# Patient Record
Sex: Male | Born: 1940 | Race: White | Hispanic: No | Smoking: Never smoker
Health system: Southern US, Community
[De-identification: ages and names within clinical notes are randomized; demographics above are authoritative.]

## PROBLEM LIST (undated history)

## (undated) DIAGNOSIS — K7460 Unspecified cirrhosis of liver: Secondary | ICD-10-CM

## (undated) DIAGNOSIS — I4891 Unspecified atrial fibrillation: Secondary | ICD-10-CM

## (undated) DIAGNOSIS — I499 Cardiac arrhythmia, unspecified: Secondary | ICD-10-CM

## (undated) DIAGNOSIS — K769 Liver disease, unspecified: Secondary | ICD-10-CM

## (undated) DIAGNOSIS — E119 Type 2 diabetes mellitus without complications: Secondary | ICD-10-CM

## (undated) HISTORY — PX: HERNIA REPAIR: SHX51

## (undated) HISTORY — PX: OTHER SURGICAL HISTORY: SHX169

## (undated) HISTORY — PX: EYE SURGERY: SHX253

---

## 2008-12-30 ENCOUNTER — Emergency Department: Payer: Self-pay | Admitting: Emergency Medicine

## 2009-01-18 ENCOUNTER — Emergency Department: Payer: Self-pay | Admitting: Emergency Medicine

## 2010-02-07 ENCOUNTER — Emergency Department: Payer: Self-pay | Admitting: Emergency Medicine

## 2010-11-21 ENCOUNTER — Inpatient Hospital Stay: Payer: Self-pay | Admitting: Surgery

## 2011-05-11 ENCOUNTER — Emergency Department: Payer: Self-pay | Admitting: *Deleted

## 2011-12-14 DIAGNOSIS — Z148 Genetic carrier of other disease: Secondary | ICD-10-CM | POA: Insufficient documentation

## 2012-02-02 ENCOUNTER — Inpatient Hospital Stay: Payer: Self-pay | Admitting: Student

## 2012-02-02 LAB — URINALYSIS, COMPLETE
Bacteria: NONE SEEN
Bilirubin,UR: NEGATIVE
Glucose,UR: 50 mg/dL (ref 0–75)
Hyaline Cast: 10
Ketone: NEGATIVE
Ph: 5 (ref 4.5–8.0)
Protein: NEGATIVE
RBC,UR: 7 /HPF (ref 0–5)
Specific Gravity: 1.006 (ref 1.003–1.030)
Squamous Epithelial: <1
WBC UR: <1 /HPF (ref 0–5)

## 2012-02-02 LAB — COMPREHENSIVE METABOLIC PANEL
Albumin: 2.9 g/dL — ABNORMAL LOW (ref 3.4–5.0)
Alkaline Phosphatase: 130 U/L (ref 50–136)
Anion Gap: 7 (ref 7–16)
BUN: 35 mg/dL — ABNORMAL HIGH (ref 7–18)
Bilirubin,Total: 1.2 mg/dL — ABNORMAL HIGH (ref 0.2–1.0)
Calcium, Total: 8.4 mg/dL — ABNORMAL LOW (ref 8.5–10.1)
Co2: 26 mmol/L (ref 21–32)
EGFR (Non-African Amer.): 31 — ABNORMAL LOW
Glucose: 322 mg/dL — ABNORMAL HIGH (ref 65–99)
Osmolality: 283 (ref 275–301)
Potassium: 4.5 mmol/L (ref 3.5–5.1)
SGOT(AST): 30 U/L (ref 15–37)
SGPT (ALT): 33 U/L (ref 12–78)
Total Protein: 7.2 g/dL (ref 6.4–8.2)

## 2012-02-02 LAB — CBC
HCT: 23.8 % — ABNORMAL LOW (ref 40.0–52.0)
MCH: 29.2 pg (ref 26.0–34.0)
MCHC: 32.6 g/dL (ref 32.0–36.0)
MCV: 89 fL (ref 80–100)
Platelet: 50 10*3/uL — ABNORMAL LOW (ref 150–440)
RBC: 2.66 10*6/uL — ABNORMAL LOW (ref 4.40–5.90)
RDW: 17.6 % — ABNORMAL HIGH (ref 11.5–14.5)

## 2012-02-02 LAB — AMMONIA: Ammonia, Plasma: 102 mcmol/L — ABNORMAL HIGH (ref 11–32)

## 2012-02-03 LAB — FERRITIN: Ferritin (ARMC): 12 ng/mL (ref 8–388)

## 2012-02-03 LAB — BASIC METABOLIC PANEL
Anion Gap: 7 (ref 7–16)
BUN: 31 mg/dL — ABNORMAL HIGH (ref 7–18)
Chloride: 103 mmol/L (ref 98–107)
Co2: 26 mmol/L (ref 21–32)
Creatinine: 1.83 mg/dL — ABNORMAL HIGH (ref 0.60–1.30)
EGFR (African American): 42 — ABNORMAL LOW
EGFR (Non-African Amer.): 37 — ABNORMAL LOW
Glucose: 239 mg/dL — ABNORMAL HIGH (ref 65–99)
Sodium: 136 mmol/L (ref 136–145)

## 2012-02-03 LAB — CBC WITH DIFFERENTIAL/PLATELET
Basophil %: 0.7 %
Eosinophil #: 0.1 10*3/uL (ref 0.0–0.7)
Eosinophil %: 2.5 %
HCT: 23.4 % — ABNORMAL LOW (ref 40.0–52.0)
Lymphocyte #: 0.4 10*3/uL — ABNORMAL LOW (ref 1.0–3.6)
Lymphocyte %: 12 %
MCH: 29.6 pg (ref 26.0–34.0)
MCHC: 33.1 g/dL (ref 32.0–36.0)
Monocyte #: 0.5 x10 3/mm (ref 0.2–1.0)
RDW: 17.6 % — ABNORMAL HIGH (ref 11.5–14.5)

## 2012-02-04 LAB — CBC WITH DIFFERENTIAL/PLATELET
Basophil #: 0 10*3/uL (ref 0.0–0.1)
Basophil %: 0.5 %
Eosinophil #: 0.1 10*3/uL (ref 0.0–0.7)
HCT: 22.5 % — ABNORMAL LOW (ref 40.0–52.0)
HGB: 7.6 g/dL — ABNORMAL LOW (ref 13.0–18.0)
Lymphocyte #: 0.4 10*3/uL — ABNORMAL LOW (ref 1.0–3.6)
Lymphocyte %: 10.1 %
MCHC: 33.8 g/dL (ref 32.0–36.0)
MCV: 90 fL (ref 80–100)
Monocyte #: 0.6 x10 3/mm (ref 0.2–1.0)
Neutrophil #: 2.6 10*3/uL (ref 1.4–6.5)
Platelet: 44 10*3/uL — ABNORMAL LOW (ref 150–440)
RDW: 17.9 % — ABNORMAL HIGH (ref 11.5–14.5)
WBC: 3.7 10*3/uL — ABNORMAL LOW (ref 3.8–10.6)

## 2012-02-04 LAB — BASIC METABOLIC PANEL
BUN: 27 mg/dL — ABNORMAL HIGH (ref 7–18)
Calcium, Total: 8.3 mg/dL — ABNORMAL LOW (ref 8.5–10.1)
Chloride: 103 mmol/L (ref 98–107)
Co2: 25 mmol/L (ref 21–32)
EGFR (Non-African Amer.): 45 — ABNORMAL LOW
Glucose: 174 mg/dL — ABNORMAL HIGH (ref 65–99)
Osmolality: 279 (ref 275–301)
Potassium: 4.5 mmol/L (ref 3.5–5.1)
Sodium: 135 mmol/L — ABNORMAL LOW (ref 136–145)

## 2012-02-04 LAB — AMMONIA: Ammonia, Plasma: 29 mcmol/L (ref 11–32)

## 2012-02-05 LAB — CBC WITH DIFFERENTIAL/PLATELET
Basophil #: 0 10*3/uL (ref 0.0–0.1)
Basophil %: 0.8 %
Eosinophil #: 0.1 10*3/uL (ref 0.0–0.7)
HCT: 22.5 % — ABNORMAL LOW (ref 40.0–52.0)
Lymphocyte #: 0.4 10*3/uL — ABNORMAL LOW (ref 1.0–3.6)
Lymphocyte %: 10.7 %
Monocyte %: 19.8 %
Platelet: 43 10*3/uL — ABNORMAL LOW (ref 150–440)
RBC: 2.51 10*6/uL — ABNORMAL LOW (ref 4.40–5.90)
RDW: 17.9 % — ABNORMAL HIGH (ref 11.5–14.5)
WBC: 3.8 10*3/uL (ref 3.8–10.6)

## 2012-09-21 ENCOUNTER — Ambulatory Visit: Payer: Self-pay | Admitting: Gastroenterology

## 2012-11-10 ENCOUNTER — Inpatient Hospital Stay: Payer: Self-pay | Admitting: Internal Medicine

## 2012-11-10 LAB — COMPREHENSIVE METABOLIC PANEL
Alkaline Phosphatase: 109 U/L (ref 50–136)
Anion Gap: 8 (ref 7–16)
BUN: 37 mg/dL — ABNORMAL HIGH (ref 7–18)
Chloride: 102 mmol/L (ref 98–107)
Creatinine: 1.56 mg/dL — ABNORMAL HIGH (ref 0.60–1.30)
Glucose: 144 mg/dL — ABNORMAL HIGH (ref 65–99)
Osmolality: 276 (ref 275–301)
Potassium: 4.4 mmol/L (ref 3.5–5.1)
SGOT(AST): 49 U/L — ABNORMAL HIGH (ref 15–37)
SGPT (ALT): 38 U/L (ref 12–78)
Sodium: 132 mmol/L — ABNORMAL LOW (ref 136–145)
Total Protein: 7.3 g/dL (ref 6.4–8.2)

## 2012-11-10 LAB — URINALYSIS, COMPLETE
Blood: NEGATIVE
Glucose,UR: NEGATIVE mg/dL (ref 0–75)
Leukocyte Esterase: NEGATIVE
Ph: 6 (ref 4.5–8.0)
Protein: NEGATIVE
WBC UR: NONE SEEN /HPF (ref 0–5)

## 2012-11-10 LAB — CBC
HCT: 24.5 % — ABNORMAL LOW (ref 40.0–52.0)
HGB: 8.3 g/dL — ABNORMAL LOW (ref 13.0–18.0)
MCH: 34.7 pg — ABNORMAL HIGH (ref 26.0–34.0)
MCHC: 33.8 g/dL (ref 32.0–36.0)
MCV: 103 fL — ABNORMAL HIGH (ref 80–100)
Platelet: 58 10*3/uL — ABNORMAL LOW (ref 150–440)
RBC: 2.39 10*6/uL — ABNORMAL LOW (ref 4.40–5.90)
RDW: 15 % — ABNORMAL HIGH (ref 11.5–14.5)

## 2012-11-11 LAB — BASIC METABOLIC PANEL
Anion Gap: 5 — ABNORMAL LOW (ref 7–16)
BUN: 30 mg/dL — ABNORMAL HIGH (ref 7–18)
Calcium, Total: 8.2 mg/dL — ABNORMAL LOW (ref 8.5–10.1)
Chloride: 108 mmol/L — ABNORMAL HIGH (ref 98–107)
Co2: 24 mmol/L (ref 21–32)
Creatinine: 1.38 mg/dL — ABNORMAL HIGH (ref 0.60–1.30)
EGFR (African American): 60 — ABNORMAL LOW
EGFR (Non-African Amer.): 51 — ABNORMAL LOW
Glucose: 93 mg/dL (ref 65–99)
Osmolality: 280 (ref 275–301)
Potassium: 4 mmol/L (ref 3.5–5.1)

## 2012-11-11 LAB — CBC WITH DIFFERENTIAL/PLATELET
Basophil #: 0 10*3/uL (ref 0.0–0.1)
Eosinophil #: 0.1 10*3/uL (ref 0.0–0.7)
Eosinophil %: 6.8 %
HCT: 20.9 % — ABNORMAL LOW (ref 40.0–52.0)
Lymphocyte #: 0.3 10*3/uL — ABNORMAL LOW (ref 1.0–3.6)
Lymphocyte %: 13.4 %
MCH: 35.8 pg — ABNORMAL HIGH (ref 26.0–34.0)
MCV: 102 fL — ABNORMAL HIGH (ref 80–100)
Neutrophil #: 1.3 10*3/uL — ABNORMAL LOW (ref 1.4–6.5)
Neutrophil %: 58.1 %
Platelet: 40 10*3/uL — ABNORMAL LOW (ref 150–440)
RBC: 2.06 10*6/uL — ABNORMAL LOW (ref 4.40–5.90)
RDW: 14.9 % — ABNORMAL HIGH (ref 11.5–14.5)
WBC: 2.2 10*3/uL — ABNORMAL LOW (ref 3.8–10.6)

## 2012-11-11 LAB — HEMOGLOBIN A1C: Hemoglobin A1C: <3.5 % — ABNORMAL LOW (ref 4.2–6.3)

## 2012-11-12 LAB — BASIC METABOLIC PANEL
Calcium, Total: 8.2 mg/dL — ABNORMAL LOW (ref 8.5–10.1)
Chloride: 107 mmol/L (ref 98–107)
Creatinine: 1.23 mg/dL (ref 0.60–1.30)
EGFR (Non-African Amer.): 59 — ABNORMAL LOW
Glucose: 75 mg/dL (ref 65–99)
Osmolality: 279 (ref 275–301)
Potassium: 3.8 mmol/L (ref 3.5–5.1)
Sodium: 138 mmol/L (ref 136–145)

## 2012-11-13 ENCOUNTER — Ambulatory Visit: Payer: Self-pay | Admitting: Internal Medicine

## 2012-11-16 ENCOUNTER — Inpatient Hospital Stay: Payer: Self-pay | Admitting: Student

## 2012-11-16 LAB — URINALYSIS, COMPLETE
Bilirubin,UR: NEGATIVE
Glucose,UR: NEGATIVE mg/dL (ref 0–75)
Ketone: NEGATIVE
Leukocyte Esterase: NEGATIVE
Ph: 5 (ref 4.5–8.0)
Protein: NEGATIVE
Specific Gravity: 1.014 (ref 1.003–1.030)
Squamous Epithelial: <1
WBC UR: <1 /HPF (ref 0–5)

## 2012-11-16 LAB — CBC
HCT: 22.7 % — ABNORMAL LOW (ref 40.0–52.0)
MCHC: 34.3 g/dL (ref 32.0–36.0)
Platelet: 47 10*3/uL — ABNORMAL LOW (ref 150–440)
RDW: 15.2 % — ABNORMAL HIGH (ref 11.5–14.5)
WBC: 3.5 10*3/uL — ABNORMAL LOW (ref 3.8–10.6)

## 2012-11-16 LAB — COMPREHENSIVE METABOLIC PANEL
Albumin: 2.7 g/dL — ABNORMAL LOW (ref 3.4–5.0)
Alkaline Phosphatase: 133 U/L (ref 50–136)
Calcium, Total: 7.9 mg/dL — ABNORMAL LOW (ref 8.5–10.1)
Co2: 23 mmol/L (ref 21–32)
EGFR (Non-African Amer.): 54 — ABNORMAL LOW
Potassium: 3.7 mmol/L (ref 3.5–5.1)
Total Protein: 6.7 g/dL (ref 6.4–8.2)

## 2012-11-16 LAB — ETHANOL: Ethanol %: <0.003 % (ref 0.000–0.080)

## 2012-11-16 LAB — TROPONIN I: Troponin-I: <0.02 ng/mL

## 2012-11-16 LAB — PROTIME-INR
INR: 1.4
Prothrombin Time: 17.5 secs — ABNORMAL HIGH (ref 11.5–14.7)

## 2012-11-16 LAB — AMMONIA: Ammonia, Plasma: 60 mcmol/L — ABNORMAL HIGH (ref 11–32)

## 2012-11-17 LAB — HEMOGLOBIN A1C: Hemoglobin A1C: <3.5 % — ABNORMAL LOW (ref 4.2–6.3)

## 2012-11-17 LAB — BASIC METABOLIC PANEL
Anion Gap: 6 — ABNORMAL LOW (ref 7–16)
BUN: 29 mg/dL — ABNORMAL HIGH (ref 7–18)
Chloride: 104 mmol/L (ref 98–107)
Creatinine: 1.48 mg/dL — ABNORMAL HIGH (ref 0.60–1.30)
Potassium: 3.8 mmol/L (ref 3.5–5.1)
Sodium: 137 mmol/L (ref 136–145)

## 2012-11-17 LAB — MAGNESIUM: Magnesium: 2.5 mg/dL — ABNORMAL HIGH

## 2012-11-18 LAB — PROTIME-INR
INR: 1.4
Prothrombin Time: 17.3 secs — ABNORMAL HIGH (ref 11.5–14.7)

## 2012-11-18 LAB — BASIC METABOLIC PANEL
Anion Gap: 5 — ABNORMAL LOW (ref 7–16)
BUN: 31 mg/dL — ABNORMAL HIGH (ref 7–18)
Calcium, Total: 8.1 mg/dL — ABNORMAL LOW (ref 8.5–10.1)
Chloride: 106 mmol/L (ref 98–107)
Co2: 26 mmol/L (ref 21–32)
EGFR (African American): 54 — ABNORMAL LOW
EGFR (Non-African Amer.): 47 — ABNORMAL LOW
Osmolality: 282 (ref 275–301)
Potassium: 4 mmol/L (ref 3.5–5.1)

## 2012-11-18 LAB — AMMONIA: Ammonia, Plasma: 64 mcmol/L — ABNORMAL HIGH (ref 11–32)

## 2012-11-18 LAB — APTT: Activated PTT: 37.4 secs — ABNORMAL HIGH (ref 23.6–35.9)

## 2012-11-19 LAB — AMMONIA: Ammonia, Plasma: 105 umol/L — ABNORMAL HIGH

## 2012-11-26 ENCOUNTER — Inpatient Hospital Stay: Payer: Self-pay | Admitting: Internal Medicine

## 2012-11-26 LAB — COMPREHENSIVE METABOLIC PANEL
Albumin: 2.8 g/dL — ABNORMAL LOW (ref 3.4–5.0)
Alkaline Phosphatase: 114 U/L (ref 50–136)
BUN: 27 mg/dL — ABNORMAL HIGH (ref 7–18)
Chloride: 103 mmol/L (ref 98–107)
EGFR (African American): 53 — ABNORMAL LOW
Glucose: 131 mg/dL — ABNORMAL HIGH (ref 65–99)
Osmolality: 275 (ref 275–301)
Sodium: 134 mmol/L — ABNORMAL LOW (ref 136–145)
Total Protein: 6.8 g/dL (ref 6.4–8.2)

## 2012-11-26 LAB — URINALYSIS, COMPLETE
Bacteria: NONE SEEN
Blood: NEGATIVE
Glucose,UR: NEGATIVE mg/dL (ref 0–75)
Nitrite: NEGATIVE
Ph: 7 (ref 4.5–8.0)
RBC,UR: 1 /HPF (ref 0–5)
Specific Gravity: 1.018 (ref 1.003–1.030)
Squamous Epithelial: <1

## 2012-11-26 LAB — PROTIME-INR
INR: 1.5
Prothrombin Time: 17.7 secs — ABNORMAL HIGH (ref 11.5–14.7)

## 2012-11-26 LAB — CBC
HGB: 7.4 g/dL — ABNORMAL LOW (ref 13.0–18.0)
MCV: 102 fL — ABNORMAL HIGH (ref 80–100)
RBC: 2.18 10*6/uL — ABNORMAL LOW (ref 4.40–5.90)
WBC: 2.7 10*3/uL — ABNORMAL LOW (ref 3.8–10.6)

## 2012-11-26 LAB — TROPONIN I: Troponin-I: <0.02 ng/mL

## 2012-11-27 LAB — CBC WITH DIFFERENTIAL/PLATELET
Basophil #: 0 10*3/uL (ref 0.0–0.1)
Basophil %: 1 %
Eosinophil #: 0.2 10*3/uL (ref 0.0–0.7)
Lymphocyte #: 0.3 10*3/uL — ABNORMAL LOW (ref 1.0–3.6)
Lymphocyte %: 14.9 %
MCH: 35.3 pg — ABNORMAL HIGH (ref 26.0–34.0)
MCHC: 34.4 g/dL (ref 32.0–36.0)
MCV: 102 fL — ABNORMAL HIGH (ref 80–100)
Monocyte #: 0.4 x10 3/mm (ref 0.2–1.0)
Monocyte %: 16.6 %
Neutrophil #: 1.3 10*3/uL — ABNORMAL LOW (ref 1.4–6.5)
RBC: 1.99 10*6/uL — ABNORMAL LOW (ref 4.40–5.90)
RDW: 17.6 % — ABNORMAL HIGH (ref 11.5–14.5)

## 2012-11-27 LAB — BASIC METABOLIC PANEL
Anion Gap: 9 (ref 7–16)
BUN: 25 mg/dL — ABNORMAL HIGH (ref 7–18)
EGFR (African American): 49 — ABNORMAL LOW
EGFR (Non-African Amer.): 42 — ABNORMAL LOW

## 2012-11-27 LAB — PROTIME-INR
INR: 1.5
Prothrombin Time: 17.9 secs — ABNORMAL HIGH (ref 11.5–14.7)

## 2012-11-28 LAB — CBC WITH DIFFERENTIAL/PLATELET
Basophil #: 0 10*3/uL (ref 0.0–0.1)
Basophil %: 1.4 %
Eosinophil #: 0.2 10*3/uL (ref 0.0–0.7)
HCT: 22.5 % — ABNORMAL LOW (ref 40.0–52.0)
Lymphocyte #: 0.4 10*3/uL — ABNORMAL LOW (ref 1.0–3.6)
Lymphocyte %: 16.4 %
MCHC: 34.7 g/dL (ref 32.0–36.0)
MCV: 100 fL (ref 80–100)
Monocyte #: 0.4 x10 3/mm (ref 0.2–1.0)
Platelet: 37 10*3/uL — ABNORMAL LOW (ref 150–440)
RBC: 2.26 10*6/uL — ABNORMAL LOW (ref 4.40–5.90)
WBC: 2.3 10*3/uL — ABNORMAL LOW (ref 3.8–10.6)

## 2012-11-28 LAB — BASIC METABOLIC PANEL
BUN: 28 mg/dL — ABNORMAL HIGH (ref 7–18)
Calcium, Total: 8.2 mg/dL — ABNORMAL LOW (ref 8.5–10.1)
Chloride: 108 mmol/L — ABNORMAL HIGH (ref 98–107)
EGFR (African American): 52 — ABNORMAL LOW
EGFR (Non-African Amer.): 45 — ABNORMAL LOW
Osmolality: 284 (ref 275–301)
Potassium: 3.7 mmol/L (ref 3.5–5.1)
Sodium: 139 mmol/L (ref 136–145)

## 2012-12-02 ENCOUNTER — Ambulatory Visit: Payer: Self-pay | Admitting: Gastroenterology

## 2012-12-02 LAB — CULTURE, BLOOD (SINGLE)

## 2012-12-09 LAB — CBC
MCH: 34.7 pg — ABNORMAL HIGH (ref 26.0–34.0)
MCHC: 34.1 g/dL (ref 32.0–36.0)
RBC: 2.57 10*6/uL — ABNORMAL LOW (ref 4.40–5.90)
RDW: 19.8 % — ABNORMAL HIGH (ref 11.5–14.5)
WBC: 4 10*3/uL (ref 3.8–10.6)

## 2012-12-10 ENCOUNTER — Observation Stay: Payer: Self-pay | Admitting: Internal Medicine

## 2012-12-10 LAB — COMPREHENSIVE METABOLIC PANEL
Albumin: 2.8 g/dL — ABNORMAL LOW (ref 3.4–5.0)
Alkaline Phosphatase: 114 U/L (ref 50–136)
BUN: 33 mg/dL — ABNORMAL HIGH (ref 7–18)
Calcium, Total: 8.2 mg/dL — ABNORMAL LOW (ref 8.5–10.1)
Calcium, Total: 8.4 mg/dL — ABNORMAL LOW (ref 8.5–10.1)
Creatinine: 1.56 mg/dL — ABNORMAL HIGH (ref 0.60–1.30)
Creatinine: 1.68 mg/dL — ABNORMAL HIGH (ref 0.60–1.30)
EGFR (African American): 47 — ABNORMAL LOW
EGFR (African American): 51 — ABNORMAL LOW
EGFR (Non-African Amer.): 40 — ABNORMAL LOW
Glucose: 122 mg/dL — ABNORMAL HIGH (ref 65–99)
Glucose: 201 mg/dL — ABNORMAL HIGH (ref 65–99)
Osmolality: 281 (ref 275–301)
Osmolality: 284 (ref 275–301)
SGOT(AST): 32 U/L (ref 15–37)
SGPT (ALT): 30 U/L (ref 12–78)
SGPT (ALT): 31 U/L (ref 12–78)
Sodium: 136 mmol/L (ref 136–145)
Total Protein: 6.4 g/dL (ref 6.4–8.2)
Total Protein: 6.9 g/dL (ref 6.4–8.2)

## 2012-12-10 LAB — URINALYSIS, COMPLETE
Bilirubin,UR: NEGATIVE
Blood: NEGATIVE
Ketone: NEGATIVE
Leukocyte Esterase: NEGATIVE
Nitrite: NEGATIVE
Ph: 5 (ref 4.5–8.0)
Protein: NEGATIVE
Squamous Epithelial: <1

## 2012-12-10 LAB — AMMONIA: Ammonia, Plasma: 184 mcmol/L — ABNORMAL HIGH (ref 11–32)

## 2012-12-11 LAB — CBC WITH DIFFERENTIAL/PLATELET
Basophil #: 0.1 10*3/uL (ref 0.0–0.1)
Basophil %: 1.9 %
Eosinophil %: 9.4 %
HCT: 23.3 % — ABNORMAL LOW (ref 40.0–52.0)
HGB: 8 g/dL — ABNORMAL LOW (ref 13.0–18.0)
Lymphocyte %: 13.9 %
MCH: 35.3 pg — ABNORMAL HIGH (ref 26.0–34.0)
MCHC: 34.5 g/dL (ref 32.0–36.0)
Monocyte #: 0.5 x10 3/mm (ref 0.2–1.0)
Monocyte %: 16.6 %
Neutrophil %: 58.2 %
Platelet: 40 10*3/uL — ABNORMAL LOW (ref 150–440)
RBC: 2.28 10*6/uL — ABNORMAL LOW (ref 4.40–5.90)

## 2012-12-11 LAB — BASIC METABOLIC PANEL
Anion Gap: 4 — ABNORMAL LOW (ref 7–16)
Creatinine: 1.51 mg/dL — ABNORMAL HIGH (ref 0.60–1.30)
Osmolality: 282 (ref 275–301)

## 2012-12-11 LAB — AMMONIA: Ammonia, Plasma: <25 mcmol/L (ref 11–32)

## 2012-12-13 ENCOUNTER — Ambulatory Visit: Payer: Self-pay | Admitting: Internal Medicine

## 2012-12-28 ENCOUNTER — Inpatient Hospital Stay: Payer: Self-pay | Admitting: Internal Medicine

## 2012-12-28 LAB — CBC
HCT: 21.7 % — ABNORMAL LOW (ref 40.0–52.0)
MCH: 35.3 pg — ABNORMAL HIGH (ref 26.0–34.0)
MCHC: 34 g/dL (ref 32.0–36.0)
Platelet: 63 10*3/uL — ABNORMAL LOW (ref 150–440)
RBC: 2.09 10*6/uL — ABNORMAL LOW (ref 4.40–5.90)
RDW: 17.8 % — ABNORMAL HIGH (ref 11.5–14.5)
WBC: 5.9 10*3/uL (ref 3.8–10.6)

## 2012-12-28 LAB — URINALYSIS, COMPLETE
Bacteria: NONE SEEN
Blood: NEGATIVE
Hyaline Cast: 18
Ketone: NEGATIVE
Ph: 5 (ref 4.5–8.0)
RBC,UR: <1 /HPF (ref 0–5)
Specific Gravity: 1.009 (ref 1.003–1.030)
Squamous Epithelial: <1

## 2012-12-28 LAB — HEMOGLOBIN: HGB: 6.4 g/dL — ABNORMAL LOW (ref 13.0–18.0)

## 2012-12-28 LAB — COMPREHENSIVE METABOLIC PANEL
Albumin: 2.7 g/dL — ABNORMAL LOW (ref 3.4–5.0)
Alkaline Phosphatase: 106 U/L (ref 50–136)
Anion Gap: 3 — ABNORMAL LOW (ref 7–16)
BUN: 42 mg/dL — ABNORMAL HIGH (ref 7–18)
Calcium, Total: 8.3 mg/dL — ABNORMAL LOW (ref 8.5–10.1)
Co2: 28 mmol/L (ref 21–32)
Creatinine: 1.92 mg/dL — ABNORMAL HIGH (ref 0.60–1.30)
EGFR (African American): 40 — ABNORMAL LOW
Potassium: 4.6 mmol/L (ref 3.5–5.1)
SGOT(AST): 42 U/L — ABNORMAL HIGH (ref 15–37)
Total Protein: 6.6 g/dL (ref 6.4–8.2)

## 2012-12-28 LAB — PROTIME-INR
INR: 1.4
Prothrombin Time: 16.7 secs — ABNORMAL HIGH (ref 11.5–14.7)

## 2012-12-29 LAB — CBC WITH DIFFERENTIAL/PLATELET
Basophil #: 0 10*3/uL (ref 0.0–0.1)
Basophil %: 0.9 %
Eosinophil #: 0.2 10*3/uL (ref 0.0–0.7)
Eosinophil %: 9.2 %
HCT: 23.2 % — ABNORMAL LOW (ref 40.0–52.0)
Lymphocyte #: 0.4 10*3/uL — ABNORMAL LOW (ref 1.0–3.6)
MCH: 33.2 pg (ref 26.0–34.0)
MCV: 99 fL (ref 80–100)
Monocyte #: 0.4 x10 3/mm (ref 0.2–1.0)
Monocyte %: 17.1 %
Neutrophil #: 1.5 10*3/uL (ref 1.4–6.5)
Neutrophil %: 58.5 %
Platelet: 44 10*3/uL — ABNORMAL LOW (ref 150–440)
RDW: 23.6 % — ABNORMAL HIGH (ref 11.5–14.5)
WBC: 2.5 10*3/uL — ABNORMAL LOW (ref 3.8–10.6)

## 2012-12-29 LAB — COMPREHENSIVE METABOLIC PANEL
Albumin: 2.4 g/dL — ABNORMAL LOW (ref 3.4–5.0)
Alkaline Phosphatase: 93 U/L (ref 50–136)
Anion Gap: 7 (ref 7–16)
Calcium, Total: 8.2 mg/dL — ABNORMAL LOW (ref 8.5–10.1)
Chloride: 106 mmol/L (ref 98–107)
Co2: 24 mmol/L (ref 21–32)
EGFR (African American): 44 — ABNORMAL LOW
EGFR (Non-African Amer.): 38 — ABNORMAL LOW
Glucose: 109 mg/dL — ABNORMAL HIGH (ref 65–99)
Potassium: 4.5 mmol/L (ref 3.5–5.1)
SGOT(AST): 36 U/L (ref 15–37)
SGPT (ALT): 29 U/L (ref 12–78)
Total Protein: 6 g/dL — ABNORMAL LOW (ref 6.4–8.2)

## 2012-12-29 LAB — HEMOGLOBIN
HGB: 5.8 g/dL — ABNORMAL LOW (ref 13.0–18.0)
HGB: 7.9 g/dL — ABNORMAL LOW (ref 13.0–18.0)

## 2012-12-29 LAB — AMMONIA: Ammonia, Plasma: 30 mcmol/L (ref 11–32)

## 2012-12-29 LAB — MAGNESIUM: Magnesium: 2 mg/dL

## 2012-12-30 LAB — CBC WITH DIFFERENTIAL/PLATELET
Eosinophil #: 0.2 10*3/uL (ref 0.0–0.7)
Eosinophil %: 7.6 %
HCT: 23.6 % — ABNORMAL LOW (ref 40.0–52.0)
Lymphocyte %: 10.8 %
MCV: 100 fL (ref 80–100)
Monocyte %: 14 %
Neutrophil #: 1.8 10*3/uL (ref 1.4–6.5)
Neutrophil %: 66.7 %
RBC: 2.37 10*6/uL — ABNORMAL LOW (ref 4.40–5.90)

## 2012-12-30 LAB — BASIC METABOLIC PANEL
Anion Gap: 7 (ref 7–16)
BUN: 35 mg/dL — ABNORMAL HIGH (ref 7–18)
Co2: 23 mmol/L (ref 21–32)
Creatinine: 1.57 mg/dL — ABNORMAL HIGH (ref 0.60–1.30)
EGFR (African American): 51 — ABNORMAL LOW
EGFR (Non-African Amer.): 44 — ABNORMAL LOW
Glucose: 98 mg/dL (ref 65–99)
Osmolality: 285 (ref 275–301)
Sodium: 139 mmol/L (ref 136–145)

## 2012-12-31 LAB — CBC WITH DIFFERENTIAL/PLATELET
Basophil #: 0 10*3/uL (ref 0.0–0.1)
Basophil %: 1.2 %
HCT: 22.9 % — ABNORMAL LOW (ref 40.0–52.0)
Lymphocyte #: 0.3 10*3/uL — ABNORMAL LOW (ref 1.0–3.6)
Lymphocyte %: 15 %
MCH: 33.6 pg (ref 26.0–34.0)
MCHC: 33.6 g/dL (ref 32.0–36.0)
MCV: 100 fL (ref 80–100)
Monocyte #: 0.4 x10 3/mm (ref 0.2–1.0)
Monocyte %: 18 %
Neutrophil #: 1.2 10*3/uL — ABNORMAL LOW (ref 1.4–6.5)
Platelet: 43 10*3/uL — ABNORMAL LOW (ref 150–440)
RBC: 2.3 10*6/uL — ABNORMAL LOW (ref 4.40–5.90)
RDW: 23 % — ABNORMAL HIGH (ref 11.5–14.5)
WBC: 2.1 10*3/uL — ABNORMAL LOW (ref 3.8–10.6)

## 2013-01-01 LAB — CBC WITH DIFFERENTIAL/PLATELET
Basophil #: 0 10*3/uL (ref 0.0–0.1)
Basophil %: 1.4 %
Eosinophil #: 0.2 10*3/uL (ref 0.0–0.7)
Eosinophil %: 7.9 %
HGB: 8 g/dL — ABNORMAL LOW (ref 13.0–18.0)
Lymphocyte #: 0.4 10*3/uL — ABNORMAL LOW (ref 1.0–3.6)
MCH: 33.3 pg (ref 26.0–34.0)
MCHC: 33.4 g/dL (ref 32.0–36.0)
MCV: 100 fL (ref 80–100)
Monocyte %: 14.7 %
RBC: 2.39 10*6/uL — ABNORMAL LOW (ref 4.40–5.90)

## 2013-01-01 LAB — AMMONIA: Ammonia, Plasma: 45 mcmol/L — ABNORMAL HIGH (ref 11–32)

## 2013-01-05 ENCOUNTER — Inpatient Hospital Stay: Payer: Self-pay | Admitting: Internal Medicine

## 2013-01-05 LAB — COMPREHENSIVE METABOLIC PANEL
Albumin: 2.8 g/dL — ABNORMAL LOW (ref 3.4–5.0)
Anion Gap: 4 — ABNORMAL LOW (ref 7–16)
BUN: 26 mg/dL — ABNORMAL HIGH (ref 7–18)
Calcium, Total: 8.5 mg/dL (ref 8.5–10.1)
Chloride: 104 mmol/L (ref 98–107)
Co2: 28 mmol/L (ref 21–32)
EGFR (African American): 41 — ABNORMAL LOW
EGFR (Non-African Amer.): 35 — ABNORMAL LOW
Osmolality: 284 (ref 275–301)
SGOT(AST): 41 U/L — ABNORMAL HIGH (ref 15–37)
Sodium: 136 mmol/L (ref 136–145)
Total Protein: 7.1 g/dL (ref 6.4–8.2)

## 2013-01-05 LAB — CBC WITH DIFFERENTIAL/PLATELET
Basophil #: 0 10*3/uL (ref 0.0–0.1)
Basophil %: 1 %
Eosinophil #: 0.2 10*3/uL (ref 0.0–0.7)
Eosinophil %: 5.7 %
HCT: 28.1 % — ABNORMAL LOW (ref 40.0–52.0)
HGB: 9.6 g/dL — ABNORMAL LOW (ref 13.0–18.0)
MCH: 34.3 pg — ABNORMAL HIGH (ref 26.0–34.0)
MCHC: 34.3 g/dL (ref 32.0–36.0)
MCV: 100 fL (ref 80–100)
Monocyte #: 0.4 x10 3/mm (ref 0.2–1.0)
Monocyte %: 11.9 %
Neutrophil #: 2.6 10*3/uL (ref 1.4–6.5)
Platelet: 50 10*3/uL — ABNORMAL LOW (ref 150–440)
RBC: 2.81 10*6/uL — ABNORMAL LOW (ref 4.40–5.90)
RDW: 22.1 % — ABNORMAL HIGH (ref 11.5–14.5)

## 2013-01-05 LAB — PROTIME-INR: INR: 1.5

## 2013-01-06 LAB — URINALYSIS, COMPLETE
Blood: NEGATIVE
Glucose,UR: NEGATIVE mg/dL (ref 0–75)
Ketone: NEGATIVE
Leukocyte Esterase: NEGATIVE
Nitrite: NEGATIVE
Ph: 5 (ref 4.5–8.0)
Protein: NEGATIVE
RBC,UR: <1 /HPF (ref 0–5)
Specific Gravity: 1.011 (ref 1.003–1.030)
Squamous Epithelial: <1
WBC UR: 1 /HPF (ref 0–5)

## 2013-01-06 LAB — CBC WITH DIFFERENTIAL/PLATELET
Eosinophil #: 0.3 10*3/uL (ref 0.0–0.7)
Eosinophil %: 6.5 %
HGB: 9 g/dL — ABNORMAL LOW (ref 13.0–18.0)
Lymphocyte #: 0.5 10*3/uL — ABNORMAL LOW (ref 1.0–3.6)
Lymphocyte %: 12.2 %
Monocyte #: 0.6 x10 3/mm (ref 0.2–1.0)
Monocyte %: 15 %
Neutrophil #: 2.6 10*3/uL (ref 1.4–6.5)
Neutrophil %: 65.4 %
Platelet: 48 10*3/uL — ABNORMAL LOW (ref 150–440)
RBC: 2.68 10*6/uL — ABNORMAL LOW (ref 4.40–5.90)
RDW: 21.9 % — ABNORMAL HIGH (ref 11.5–14.5)

## 2013-01-06 LAB — COMPREHENSIVE METABOLIC PANEL
Anion Gap: 8 (ref 7–16)
Bilirubin,Total: 1.6 mg/dL — ABNORMAL HIGH (ref 0.2–1.0)
Chloride: 107 mmol/L (ref 98–107)
Co2: 25 mmol/L (ref 21–32)
Creatinine: 1.77 mg/dL — ABNORMAL HIGH (ref 0.60–1.30)
EGFR (African American): 44 — ABNORMAL LOW
EGFR (Non-African Amer.): 38 — ABNORMAL LOW
Glucose: 125 mg/dL — ABNORMAL HIGH (ref 65–99)
Osmolality: 285 (ref 275–301)
Potassium: 4 mmol/L (ref 3.5–5.1)
SGPT (ALT): 26 U/L (ref 12–78)
Sodium: 140 mmol/L (ref 136–145)

## 2013-01-07 LAB — BASIC METABOLIC PANEL
Anion Gap: 4 — ABNORMAL LOW (ref 7–16)
Chloride: 107 mmol/L (ref 98–107)
Creatinine: 1.82 mg/dL — ABNORMAL HIGH (ref 0.60–1.30)
EGFR (African American): 42 — ABNORMAL LOW
EGFR (Non-African Amer.): 37 — ABNORMAL LOW
Osmolality: 280 (ref 275–301)
Potassium: 4.3 mmol/L (ref 3.5–5.1)
Sodium: 136 mmol/L (ref 136–145)

## 2013-01-07 LAB — CBC WITH DIFFERENTIAL/PLATELET
Basophil #: 0 10*3/uL (ref 0.0–0.1)
Basophil %: 0.9 %
Eosinophil #: 0.2 10*3/uL (ref 0.0–0.7)
Lymphocyte #: 0.5 10*3/uL — ABNORMAL LOW (ref 1.0–3.6)
MCH: 34.3 pg — ABNORMAL HIGH (ref 26.0–34.0)
MCHC: 34.8 g/dL (ref 32.0–36.0)
MCV: 99 fL (ref 80–100)
Monocyte #: 0.6 x10 3/mm (ref 0.2–1.0)
Monocyte %: 11.2 %
Neutrophil #: 4.1 10*3/uL (ref 1.4–6.5)
Neutrophil %: 74.9 %
Platelet: 49 10*3/uL — ABNORMAL LOW (ref 150–440)
WBC: 5.5 10*3/uL (ref 3.8–10.6)

## 2013-01-07 LAB — OCCULT BLOOD X 1 CARD TO LAB, STOOL: Occult Blood, Feces: POSITIVE

## 2013-01-08 LAB — BASIC METABOLIC PANEL
BUN: 25 mg/dL — ABNORMAL HIGH (ref 7–18)
Chloride: 108 mmol/L — ABNORMAL HIGH (ref 98–107)
Co2: 24 mmol/L (ref 21–32)
Creatinine: 1.72 mg/dL — ABNORMAL HIGH (ref 0.60–1.30)
EGFR (Non-African Amer.): 39 — ABNORMAL LOW
Glucose: 136 mg/dL — ABNORMAL HIGH (ref 65–99)

## 2013-01-08 LAB — CBC WITH DIFFERENTIAL/PLATELET
Basophil %: 1 %
Eosinophil #: 0.2 10*3/uL (ref 0.0–0.7)
Eosinophil %: 7.2 %
HCT: 23.6 % — ABNORMAL LOW (ref 40.0–52.0)
Lymphocyte %: 10.5 %
MCV: 99 fL (ref 80–100)
Monocyte #: 0.4 x10 3/mm (ref 0.2–1.0)
Monocyte %: 12.8 %
Neutrophil %: 68.5 %
RBC: 2.38 10*6/uL — ABNORMAL LOW (ref 4.40–5.90)
RDW: 21.2 % — ABNORMAL HIGH (ref 11.5–14.5)
WBC: 3.1 10*3/uL — ABNORMAL LOW (ref 3.8–10.6)

## 2013-01-08 LAB — LIPID PANEL
Cholesterol: 73 mg/dL (ref 0–200)
HDL Cholesterol: 36 mg/dL — ABNORMAL LOW (ref 40–60)
Triglycerides: 55 mg/dL (ref 0–200)

## 2013-01-08 LAB — HEMOGLOBIN: HGB: 8.1 g/dL — ABNORMAL LOW (ref 13.0–18.0)

## 2013-01-20 DIAGNOSIS — E279 Disorder of adrenal gland, unspecified: Secondary | ICD-10-CM | POA: Insufficient documentation

## 2013-02-05 ENCOUNTER — Emergency Department: Payer: Self-pay | Admitting: Emergency Medicine

## 2013-02-05 LAB — COMPREHENSIVE METABOLIC PANEL
Albumin: 3.1 g/dL — ABNORMAL LOW (ref 3.4–5.0)
Alkaline Phosphatase: 125 U/L (ref 50–136)
Anion Gap: 6 — ABNORMAL LOW (ref 7–16)
BUN: 38 mg/dL — ABNORMAL HIGH (ref 7–18)
Chloride: 100 mmol/L (ref 98–107)
Co2: 26 mmol/L (ref 21–32)
Creatinine: 1.75 mg/dL — ABNORMAL HIGH (ref 0.60–1.30)
EGFR (African American): 44 — ABNORMAL LOW
EGFR (Non-African Amer.): 38 — ABNORMAL LOW
Glucose: 256 mg/dL — ABNORMAL HIGH (ref 65–99)
Osmolality: 282 (ref 275–301)
Potassium: 4.6 mmol/L (ref 3.5–5.1)
SGPT (ALT): 36 U/L (ref 12–78)
Sodium: 132 mmol/L — ABNORMAL LOW (ref 136–145)
Total Protein: 7.3 g/dL (ref 6.4–8.2)

## 2013-02-05 LAB — CBC WITH DIFFERENTIAL/PLATELET
Basophil #: 0 10*3/uL (ref 0.0–0.1)
Eosinophil #: 0.2 10*3/uL (ref 0.0–0.7)
Eosinophil %: 7 %
Lymphocyte #: 0.2 10*3/uL — ABNORMAL LOW (ref 1.0–3.6)
Lymphocyte %: 7.1 %
MCH: 37.4 pg — ABNORMAL HIGH (ref 26.0–34.0)
MCHC: 34.6 g/dL (ref 32.0–36.0)
MCV: 108 fL — ABNORMAL HIGH (ref 80–100)
Monocyte #: 0.4 x10 3/mm (ref 0.2–1.0)
Neutrophil #: 2.4 10*3/uL (ref 1.4–6.5)
Neutrophil %: 73.4 %
Platelet: 41 10*3/uL — ABNORMAL LOW (ref 150–440)
RBC: 2.49 10*6/uL — ABNORMAL LOW (ref 4.40–5.90)
RDW: 17.7 % — ABNORMAL HIGH (ref 11.5–14.5)
WBC: 3.3 10*3/uL — ABNORMAL LOW (ref 3.8–10.6)

## 2013-02-05 LAB — URINALYSIS, COMPLETE
Bacteria: NONE SEEN
Leukocyte Esterase: NEGATIVE
Nitrite: NEGATIVE
Protein: NEGATIVE
RBC,UR: 1 /HPF (ref 0–5)
Specific Gravity: 1.005 (ref 1.003–1.030)
WBC UR: <1 /HPF (ref 0–5)

## 2013-02-05 LAB — AMMONIA: Ammonia, Plasma: 50 mcmol/L — ABNORMAL HIGH (ref 11–32)

## 2013-03-08 DIAGNOSIS — N183 Chronic kidney disease, stage 3 unspecified: Secondary | ICD-10-CM | POA: Insufficient documentation

## 2013-03-24 ENCOUNTER — Emergency Department: Payer: Self-pay | Admitting: Emergency Medicine

## 2013-03-24 LAB — CBC
HCT: 22.7 % — ABNORMAL LOW (ref 40.0–52.0)
MCH: 39 pg — ABNORMAL HIGH (ref 26.0–34.0)
RBC: 2.12 10*6/uL — ABNORMAL LOW (ref 4.40–5.90)
RDW: 17.9 % — ABNORMAL HIGH (ref 11.5–14.5)

## 2013-03-24 LAB — PROTIME-INR: INR: 1.5

## 2013-03-24 LAB — COMPREHENSIVE METABOLIC PANEL
Albumin: 2.9 g/dL — ABNORMAL LOW (ref 3.4–5.0)
Alkaline Phosphatase: 193 U/L — ABNORMAL HIGH (ref 50–136)
Chloride: 103 mmol/L (ref 98–107)
Co2: 20 mmol/L — ABNORMAL LOW (ref 21–32)
EGFR (African American): 41 — ABNORMAL LOW
Glucose: 157 mg/dL — ABNORMAL HIGH (ref 65–99)
SGPT (ALT): 33 U/L (ref 12–78)
Sodium: 133 mmol/L — ABNORMAL LOW (ref 136–145)

## 2013-03-24 LAB — AMMONIA: Ammonia, Plasma: 92 mcmol/L — ABNORMAL HIGH (ref 11–32)

## 2013-09-08 ENCOUNTER — Emergency Department: Payer: Self-pay | Admitting: Emergency Medicine

## 2013-09-08 LAB — CBC
HCT: 34.4 % — ABNORMAL LOW (ref 40.0–52.0)
HGB: 11.7 g/dL — ABNORMAL LOW (ref 13.0–18.0)
MCH: 31.9 pg (ref 26.0–34.0)
MCHC: 33.9 g/dL (ref 32.0–36.0)
MCV: 94 fL (ref 80–100)
PLATELETS: 146 10*3/uL — AB (ref 150–440)
RBC: 3.66 10*6/uL — AB (ref 4.40–5.90)
RDW: 15.1 % — ABNORMAL HIGH (ref 11.5–14.5)
WBC: 5.9 10*3/uL (ref 3.8–10.6)

## 2013-09-08 LAB — URINALYSIS, COMPLETE
Bacteria: NONE SEEN
Bilirubin,UR: NEGATIVE
GLUCOSE, UR: NEGATIVE mg/dL (ref 0–75)
Ketone: NEGATIVE
LEUKOCYTE ESTERASE: NEGATIVE
NITRITE: NEGATIVE
PH: 5 (ref 4.5–8.0)
Protein: NEGATIVE
Specific Gravity: 1.018 (ref 1.003–1.030)
WBC UR: 1 /HPF (ref 0–5)

## 2013-09-08 LAB — COMPREHENSIVE METABOLIC PANEL
Albumin: 3.7 g/dL (ref 3.4–5.0)
Alkaline Phosphatase: 110 U/L
Anion Gap: 2 — ABNORMAL LOW (ref 7–16)
BILIRUBIN TOTAL: 0.9 mg/dL (ref 0.2–1.0)
BUN: 36 mg/dL — ABNORMAL HIGH (ref 7–18)
CREATININE: 1.39 mg/dL — AB (ref 0.60–1.30)
Calcium, Total: 9.5 mg/dL (ref 8.5–10.1)
Chloride: 103 mmol/L (ref 98–107)
Co2: 31 mmol/L (ref 21–32)
EGFR (African American): 59 — ABNORMAL LOW
EGFR (Non-African Amer.): 51 — ABNORMAL LOW
GLUCOSE: 162 mg/dL — AB (ref 65–99)
OSMOLALITY: 284 (ref 275–301)
Potassium: 5 mmol/L (ref 3.5–5.1)
SGOT(AST): 13 U/L — ABNORMAL LOW (ref 15–37)
SGPT (ALT): 19 U/L (ref 12–78)
SODIUM: 136 mmol/L (ref 136–145)
TOTAL PROTEIN: 7.6 g/dL (ref 6.4–8.2)

## 2013-11-02 ENCOUNTER — Encounter: Payer: Self-pay | Admitting: Surgery

## 2013-11-13 ENCOUNTER — Encounter: Payer: Self-pay | Admitting: Surgery

## 2013-12-13 ENCOUNTER — Encounter: Payer: Self-pay | Admitting: Surgery

## 2014-01-27 ENCOUNTER — Emergency Department: Payer: Self-pay | Admitting: Student

## 2014-01-27 LAB — COMPREHENSIVE METABOLIC PANEL
Albumin: 3.4 g/dL (ref 3.4–5.0)
Alkaline Phosphatase: 141 U/L — ABNORMAL HIGH
Anion Gap: 11 (ref 7–16)
BUN: 33 mg/dL — AB (ref 7–18)
Bilirubin,Total: 1.4 mg/dL — ABNORMAL HIGH (ref 0.2–1.0)
Calcium, Total: 8.2 mg/dL — ABNORMAL LOW (ref 8.5–10.1)
Chloride: 111 mmol/L — ABNORMAL HIGH (ref 98–107)
Co2: 20 mmol/L — ABNORMAL LOW (ref 21–32)
Creatinine: 1.29 mg/dL (ref 0.60–1.30)
EGFR (African American): >60
EGFR (Non-African Amer.): 55 — ABNORMAL LOW
Glucose: 237 mg/dL — ABNORMAL HIGH (ref 65–99)
OSMOLALITY: 298 (ref 275–301)
Potassium: 4.5 mmol/L (ref 3.5–5.1)
SGOT(AST): 75 U/L — ABNORMAL HIGH (ref 15–37)
SGPT (ALT): 127 U/L — ABNORMAL HIGH
Sodium: 142 mmol/L (ref 136–145)
Total Protein: 6.7 g/dL (ref 6.4–8.2)

## 2014-01-27 LAB — CBC
HCT: 34.7 % — ABNORMAL LOW (ref 40.0–52.0)
HGB: 11.4 g/dL — ABNORMAL LOW (ref 13.0–18.0)
MCH: 34.1 pg — ABNORMAL HIGH (ref 26.0–34.0)
MCHC: 32.9 g/dL (ref 32.0–36.0)
MCV: 104 fL — ABNORMAL HIGH (ref 80–100)
PLATELETS: 99 10*3/uL — AB (ref 150–440)
RBC: 3.34 10*6/uL — AB (ref 4.40–5.90)
RDW: 15.5 % — ABNORMAL HIGH (ref 11.5–14.5)
WBC: 3 10*3/uL — ABNORMAL LOW (ref 3.8–10.6)

## 2014-01-27 LAB — URINALYSIS, COMPLETE
Bacteria: NONE SEEN
Bilirubin,UR: NEGATIVE
Blood: NEGATIVE
Glucose,UR: NEGATIVE mg/dL
Ketone: NEGATIVE
Leukocyte Esterase: NEGATIVE
Nitrite: NEGATIVE
Ph: 5
Protein: NEGATIVE
RBC,UR: 9 /HPF
Specific Gravity: 1.019
Squamous Epithelial: NONE SEEN
WBC UR: <1 /HPF

## 2014-01-27 LAB — TROPONIN I: Troponin-I: <0.02 ng/mL

## 2014-02-01 LAB — CULTURE, BLOOD (SINGLE)

## 2014-03-12 DIAGNOSIS — B259 Cytomegaloviral disease, unspecified: Secondary | ICD-10-CM | POA: Insufficient documentation

## 2014-03-29 DIAGNOSIS — D849 Immunodeficiency, unspecified: Secondary | ICD-10-CM | POA: Insufficient documentation

## 2014-06-25 IMAGING — US ABDOMEN ULTRASOUND
1 series · 13 of 25 positions shown · non-contrast
Comparison: none

REASON FOR EXAM: ascites cirrhosis encephalopathy [REDACTED] Gen
COMMENTS:

[Series 1: abdomen ultrasound · 0.33mm/px · 13 of 114 slices shown]
[im 1/114]
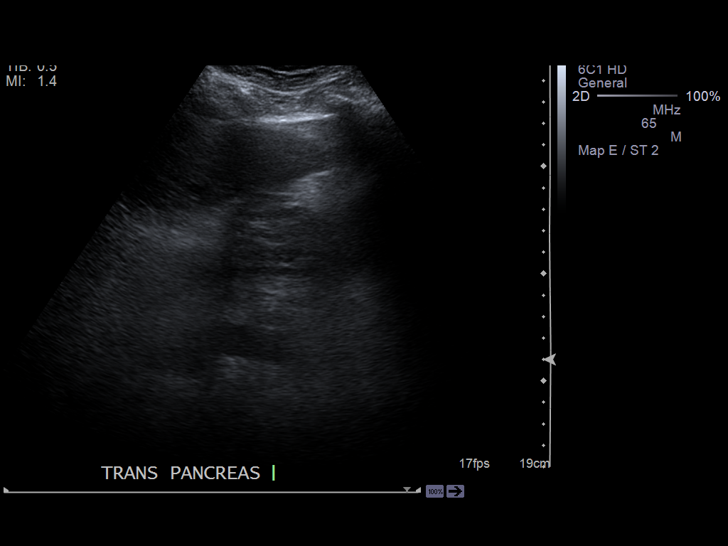
[im 10/114]
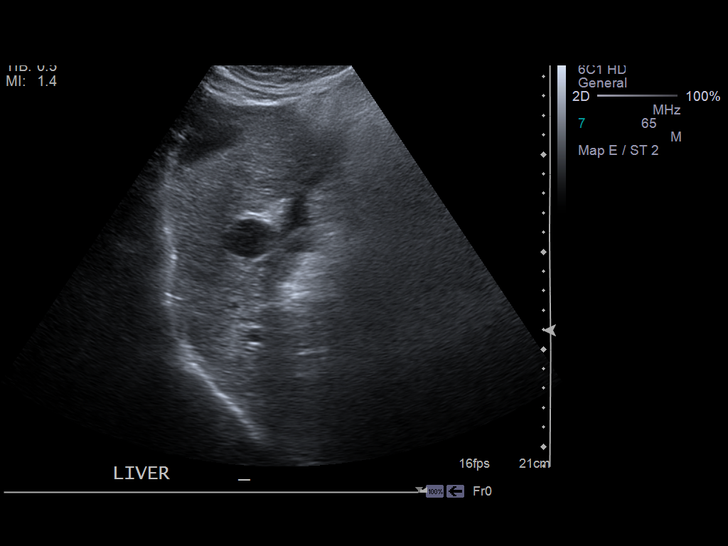
[im 19/114]
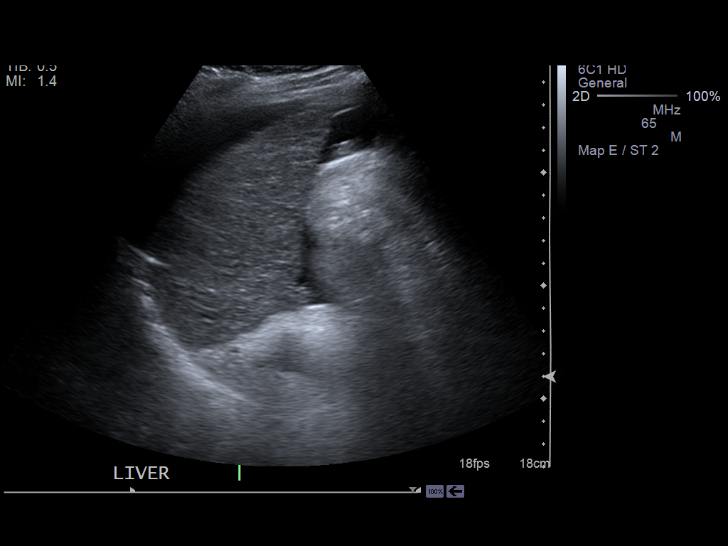
[im 29/114]
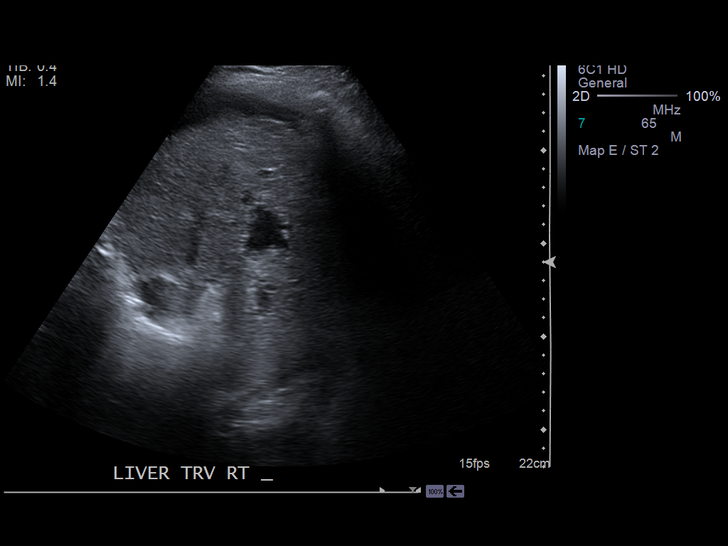
[im 38/114]
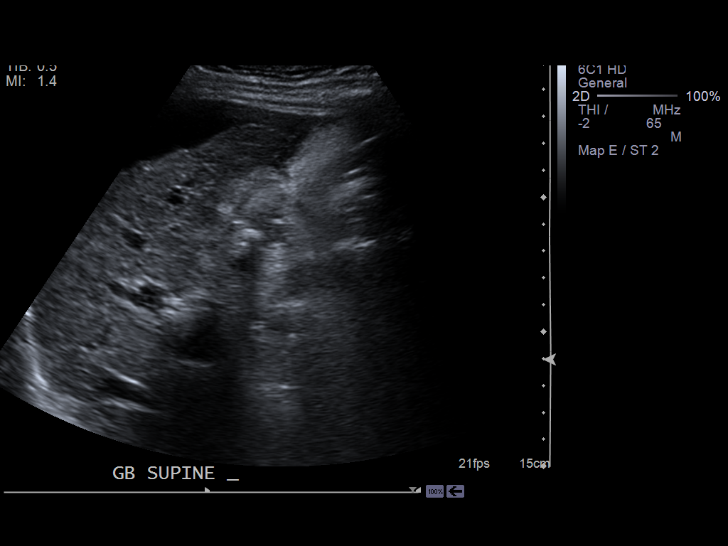
[im 48/114]
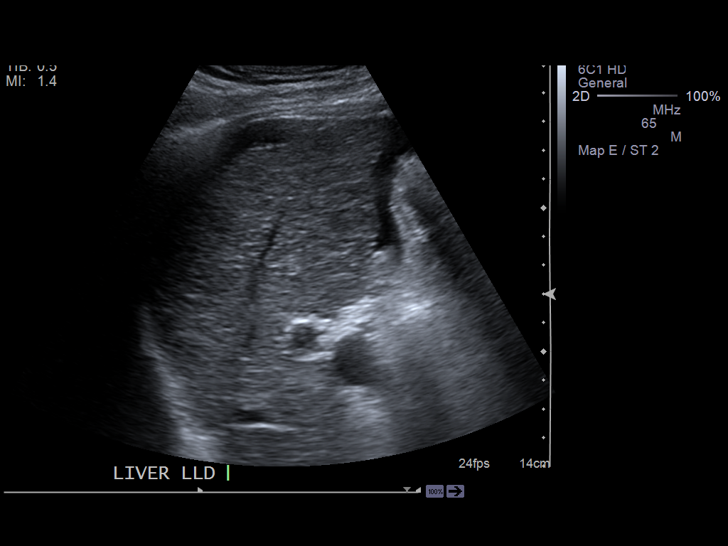
[im 57/114]
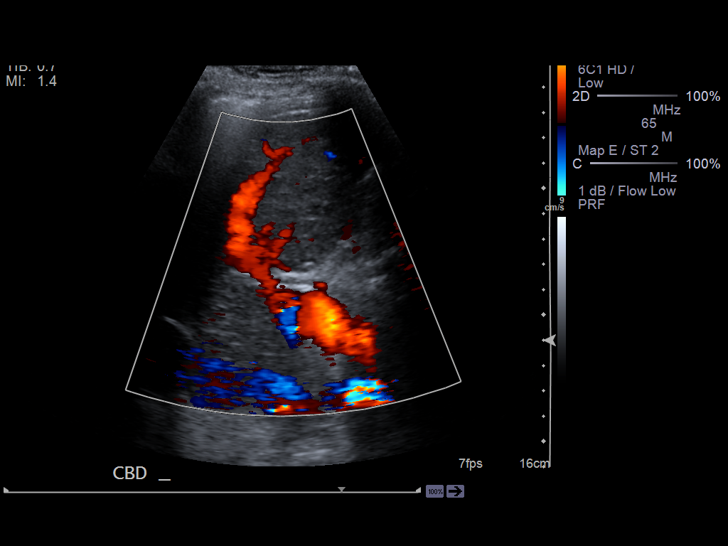
[im 66/114]
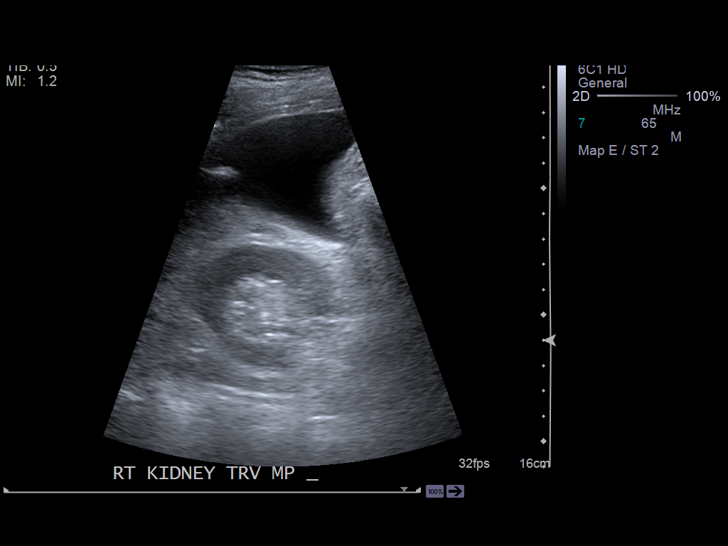
[im 76/114]
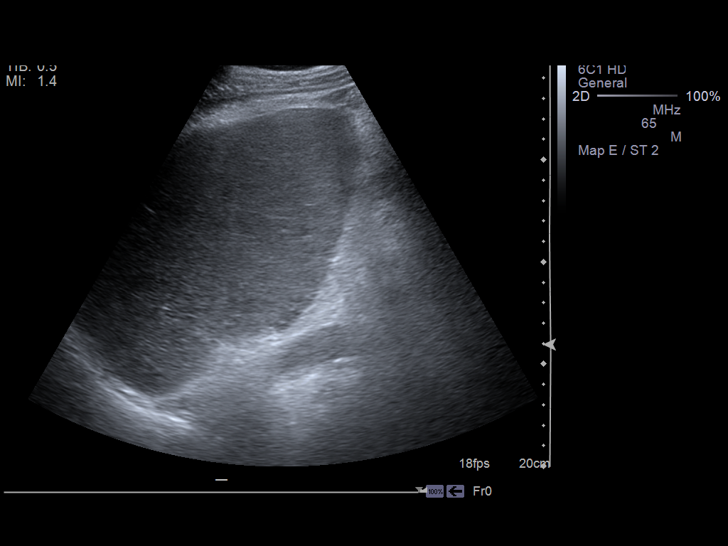
[im 85/114]
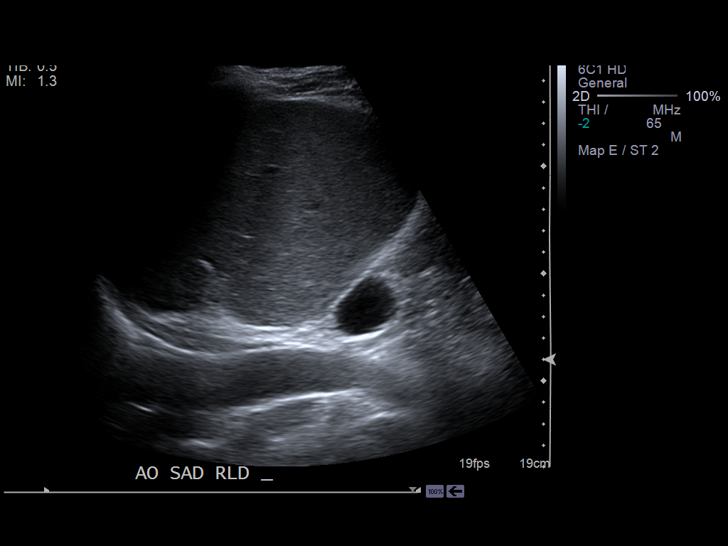
[im 95/114]
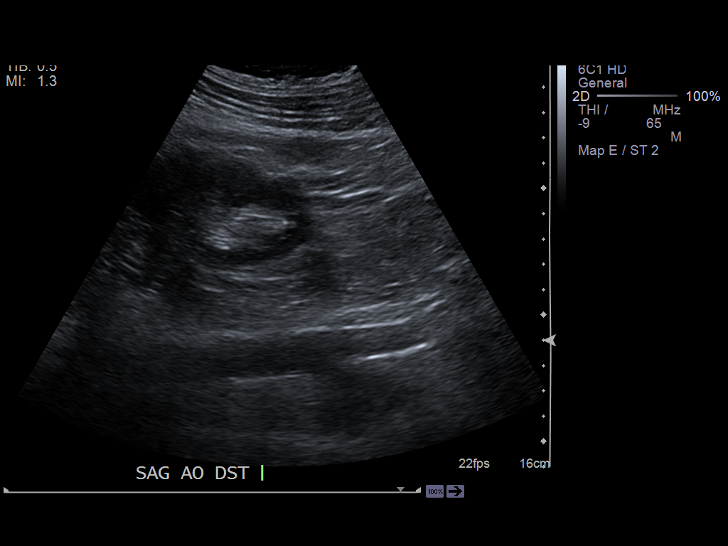
[im 104/114]
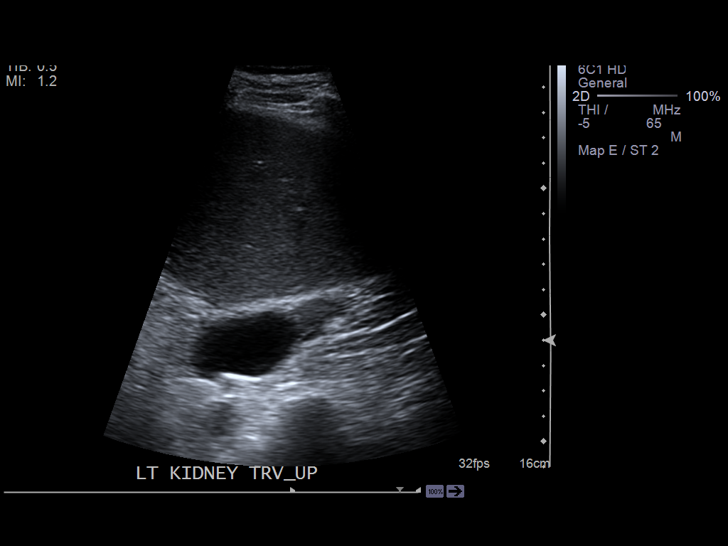
[im 114/114]
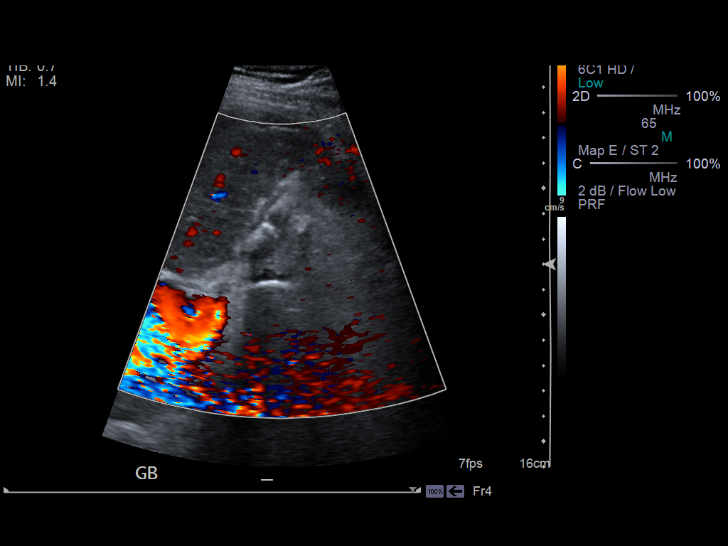

[13 of 25 positions shown; findings below may reference images not displayed]

PROCEDURE:     US  - US ABDOMEN GENERAL SURVEY  - September 21, 2012 [DATE]

RESULT:     The liver is small and its echotexture is somewhat decreased.
There is no focal mass nor ductal dilation. Portal venous flow remains
normal in direction toward the liver. The spleen is enlarged measuring
cm in greatest dimension. Bowel gas limited evaluation of the pancreas.

The gallbladder is contracted and contains a nonmobile shadowing stones.
There is no positive sonographic Murphy's sign. The gallbladder wall is
thickened at 4.8 mm. The common bile duct is normal at 2 mm in diameter.

The kidneys exhibit no evidence of obstruction. There is a 4.4 centimeter
diameter cyst in the upper pole of the left kidney. The abdominal aorta and
inferior vena cava exhibit no acute abnormalities.

Ascites is present throughout the abdomen.
IMPRESSION: 1. The liver is decreased in size and the spleen is enlarged consistent with
known cirrhosis. Portal venous flow direction remains normal in direction
toward the liver.
2. There is ascites demonstrated in all 4 quadrants of the abdomen.
3. There are stones present within the gallbladder and there is mild
gallbladder wall thickening but there is no positive sonographic Murphy's
sign.
4. The kidneys exhibit no acute abnormalities.

[REDACTED]

## 2014-08-01 DIAGNOSIS — K754 Autoimmune hepatitis: Secondary | ICD-10-CM | POA: Insufficient documentation

## 2014-08-01 DIAGNOSIS — T8643 Liver transplant infection: Secondary | ICD-10-CM | POA: Insufficient documentation

## 2014-08-20 IMAGING — CR DG CHEST 1V PORT
1 series · 1 of 1 positions shown · non-contrast
Comparison: none

REASON FOR EXAM: Altered Mental Status
COMMENTS:

[ap]
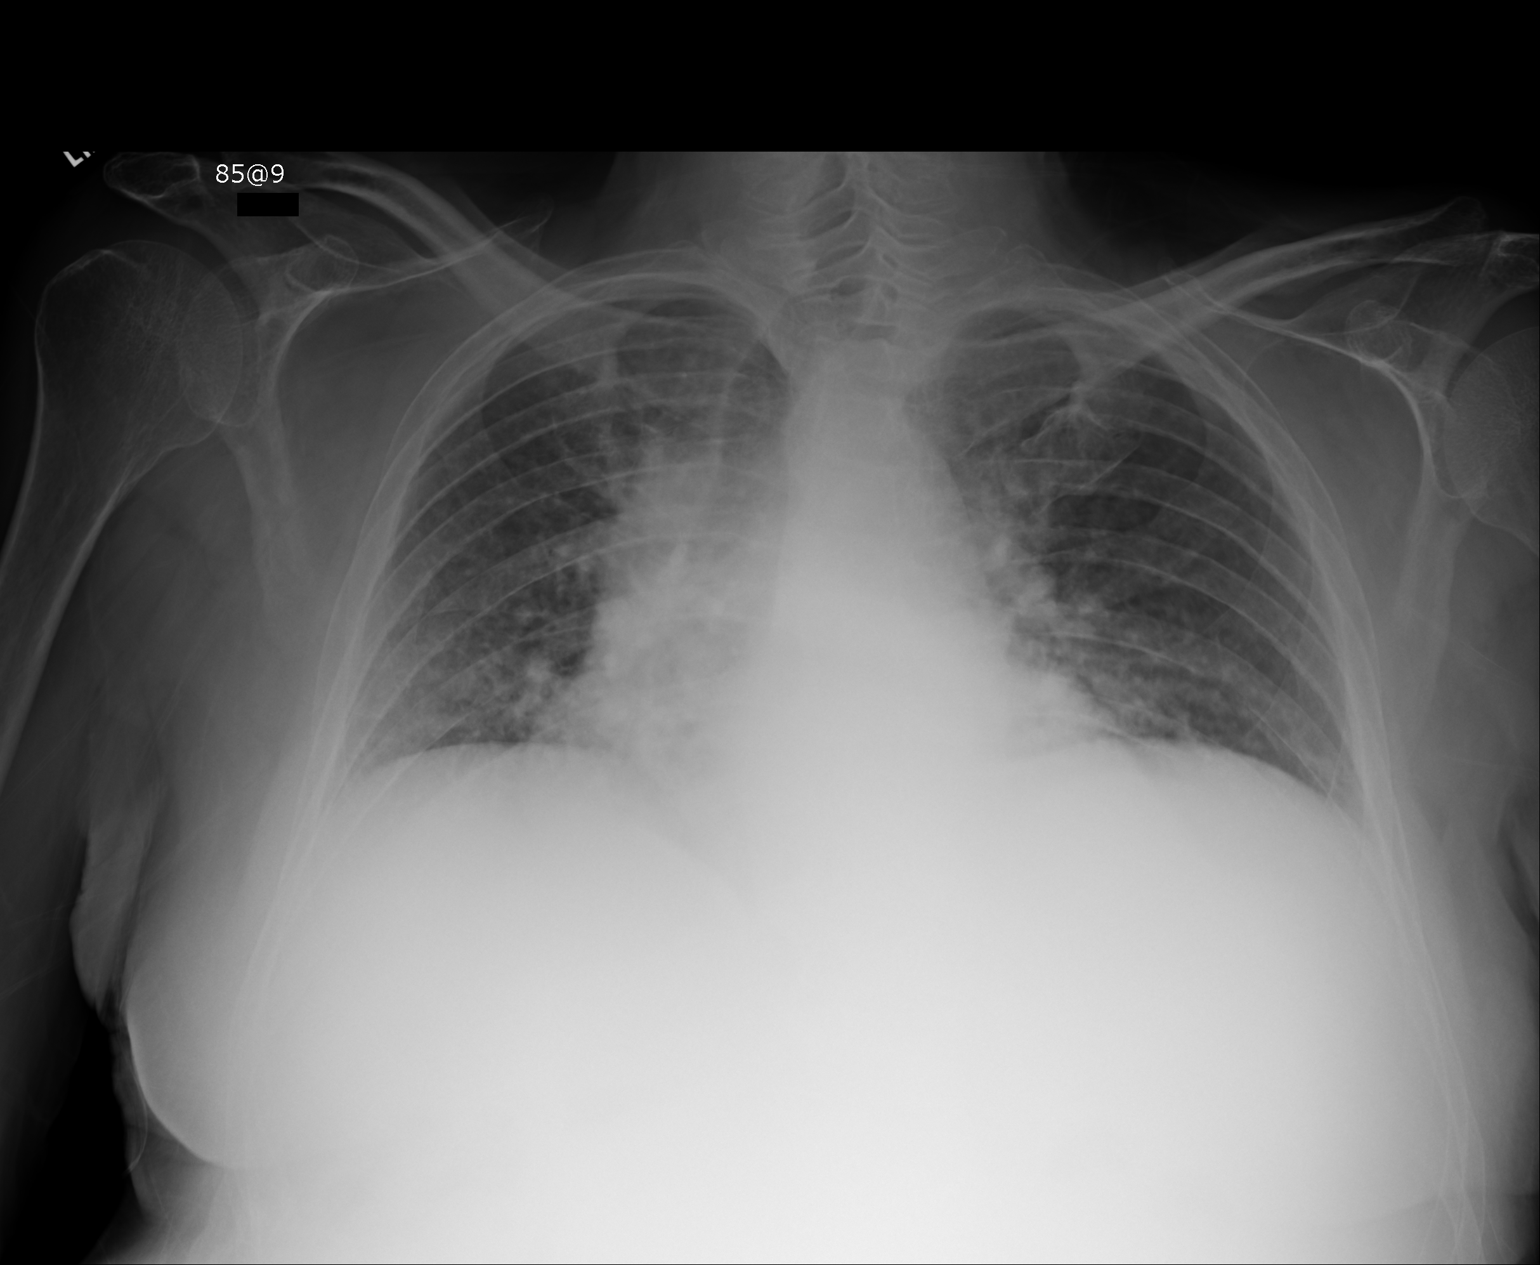

[1 of 1 positions shown; findings below may reference images not displayed]

PROCEDURE:     DXR - DXR PORTABLE CHEST SINGLE VIEW  - November 16, 2012  [DATE]

RESULT:     Comparison is made to the study February 02, 2012.

The lungs are hypoinflated. The cardiac silhouette is enlarged but this is
not a new finding. There is increased conspicuity the perihilar lung
markings. This is in part due to vascular crowding but I an suspicious that
there is mild interstitial edema. There is no focal pneumonia.
IMPRESSION: The findings are worrisome for low-grade CHF. A followup PA
and lateral chest x-ray with deep inspiration would be of value.

[REDACTED]

## 2014-08-30 IMAGING — CT CT HEAD WITHOUT CONTRAST
1 series · 16 of 30 positions shown, 20 images · non-contrast
Comparison: none

REASON FOR EXAM: confusion
COMMENTS:

PROCEDURE:     CT  - CT HEAD WITHOUT CONTRAST  - November 26, 2012  [DATE]
RESULT:     History: Confusion.
Comparison Study: No prior.

[Series 2: soft tissue · axial · 0.42mm/px · z∈[-168,-32]mm · 16 of 30 slices shown, 20 images]
[im 2/30  brain]
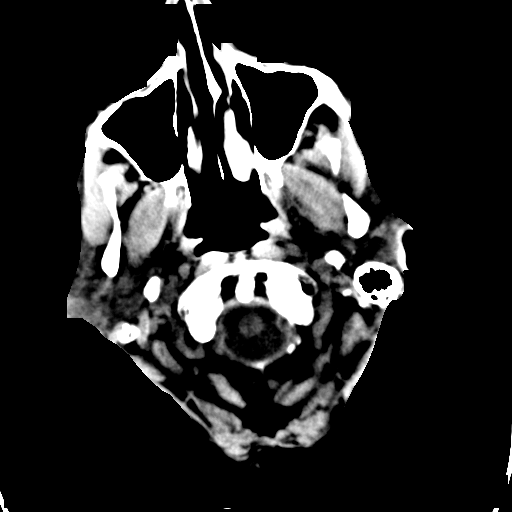
[im 2/30  bone]
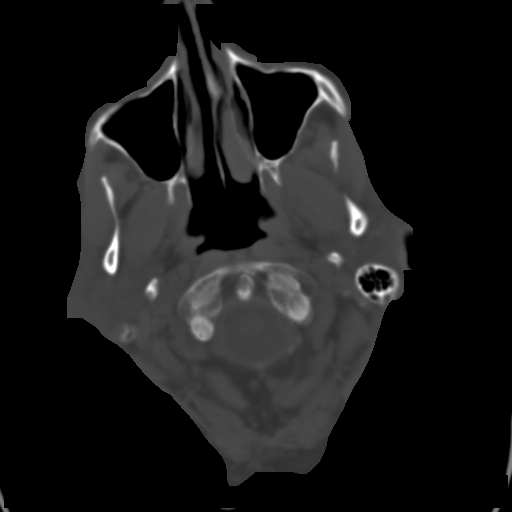
[im 4/30  brain]
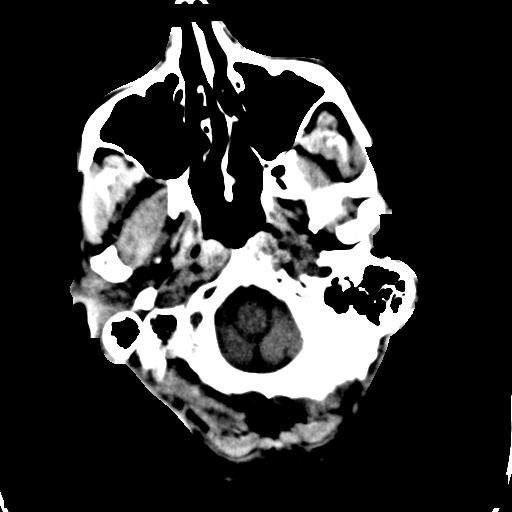
[im 6/30  brain]
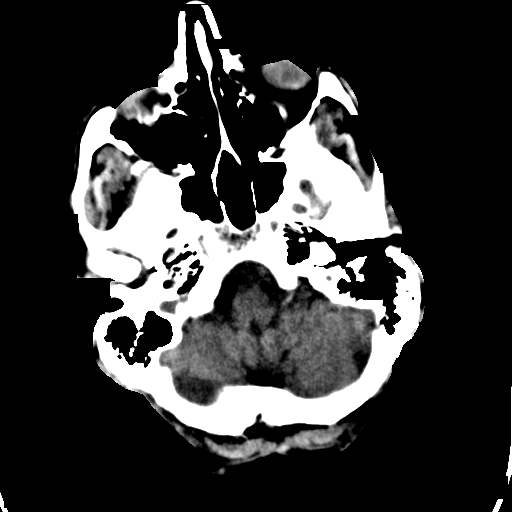
[im 8/30  brain]
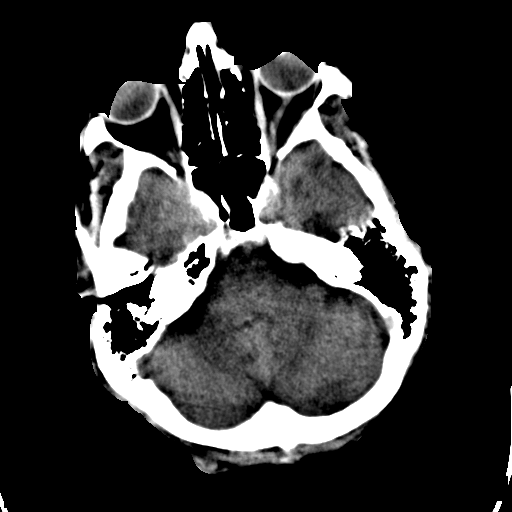
[im 9/30  brain]
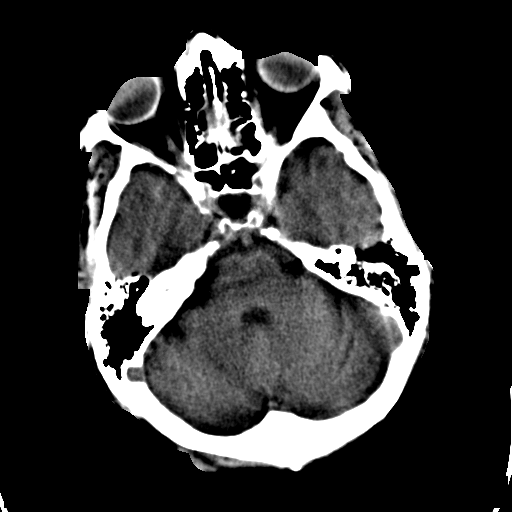
[im 9/30  bone]
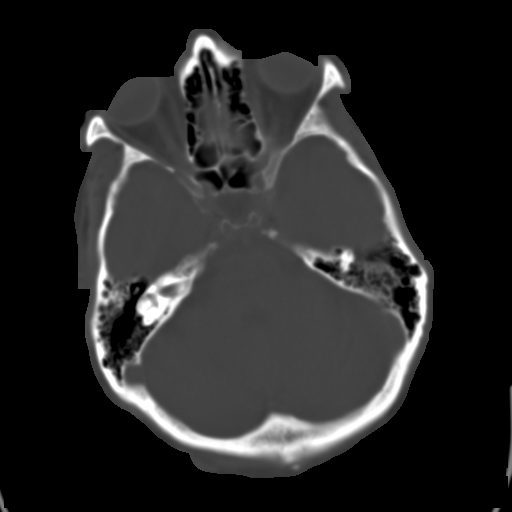
[im 11/30  brain]
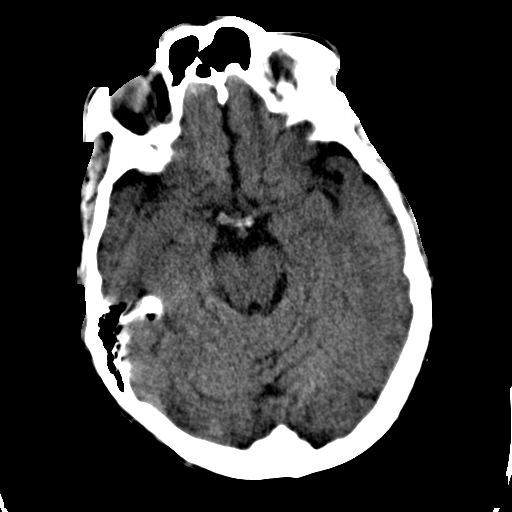
[im 13/30  brain]
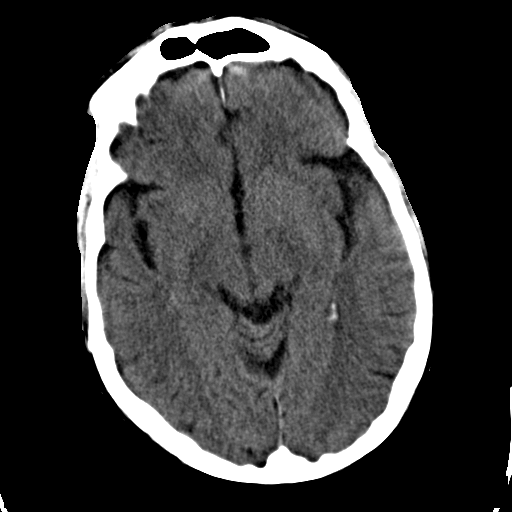
[im 15/30  brain]
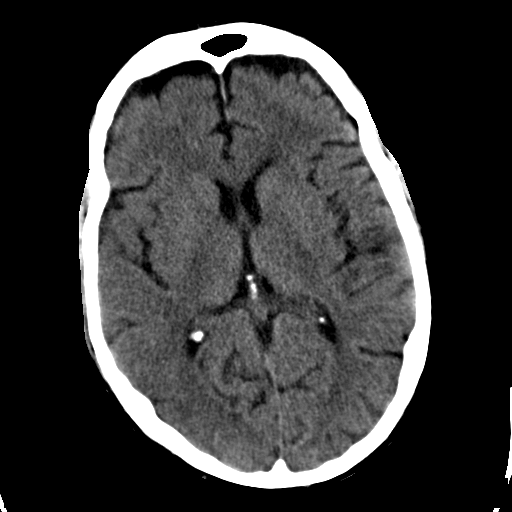
[im 16/30  brain]
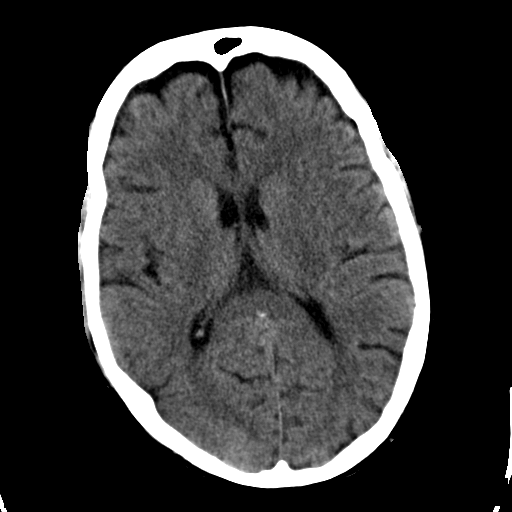
[im 16/30  bone]
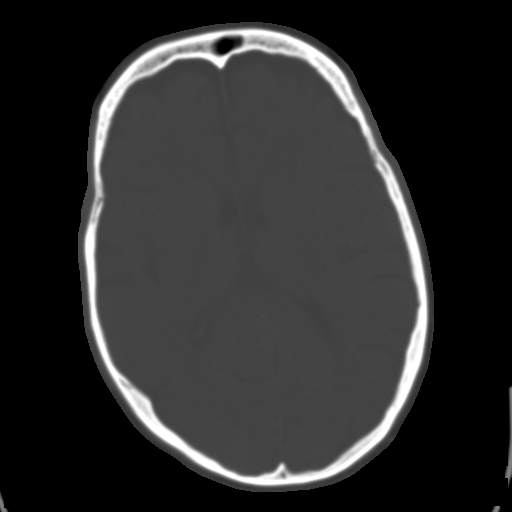
[im 18/30  brain]
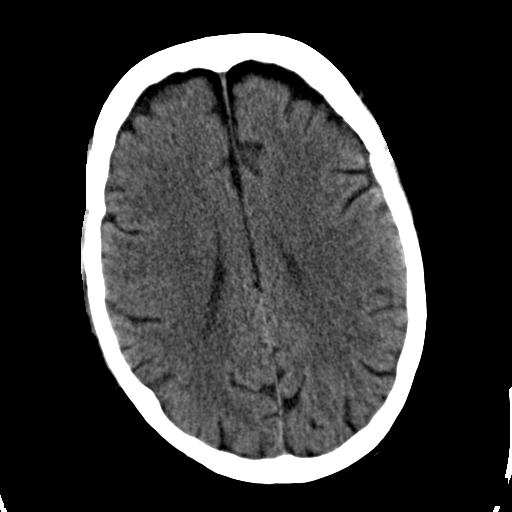
[im 20/30  brain]
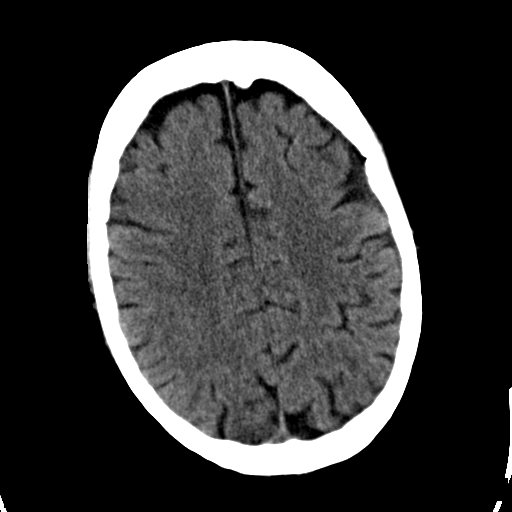
[im 22/30  brain]
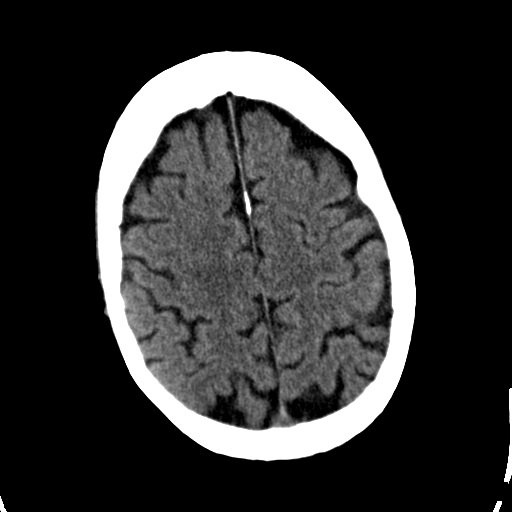
[im 23/30  brain]
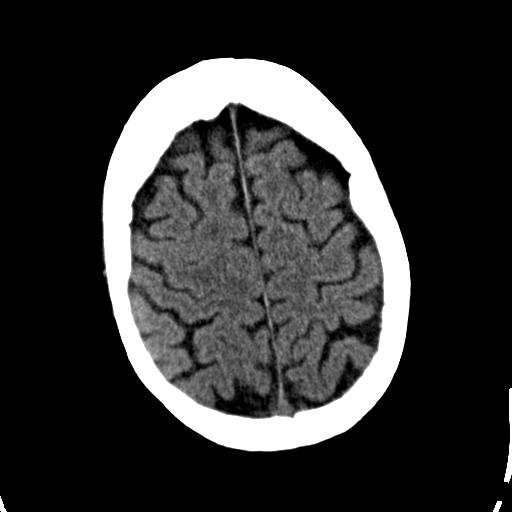
[im 23/30  bone]
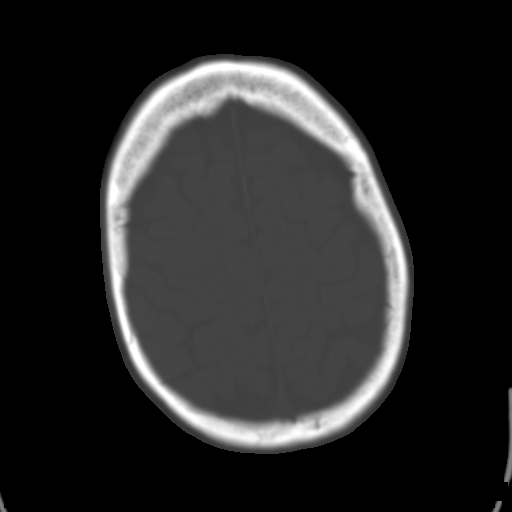
[im 25/30  brain]
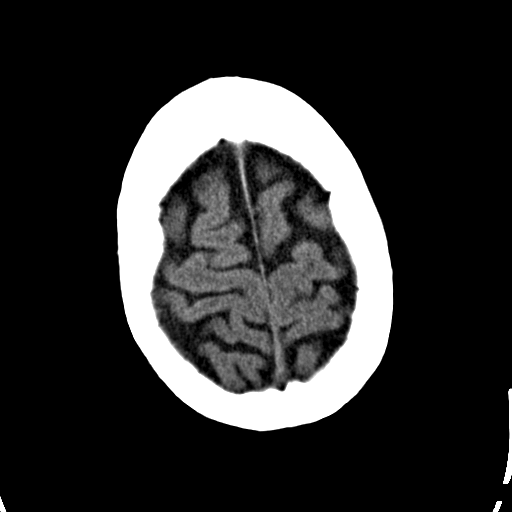
[im 27/30  brain]
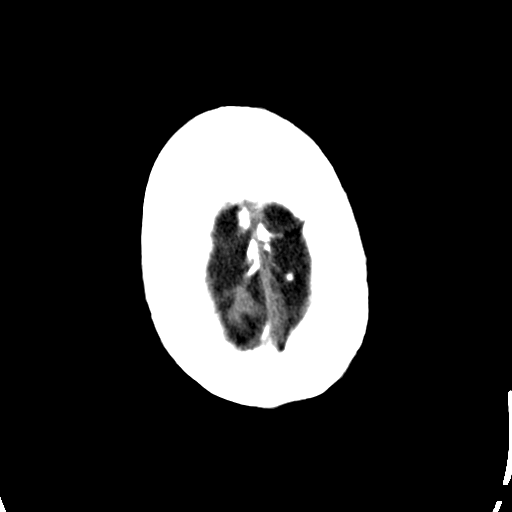
[im 29/30  brain]
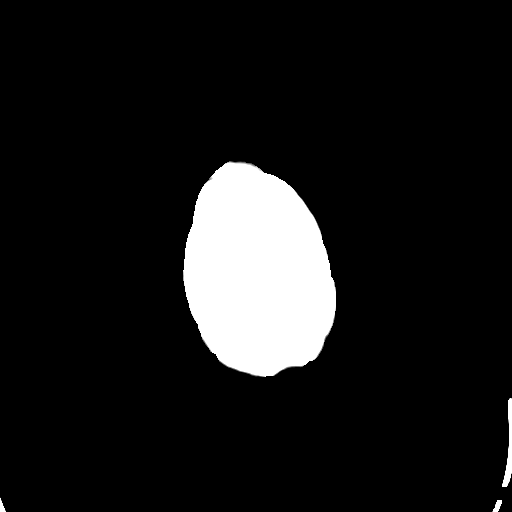

[16 of 30 positions shown; findings below may reference images not displayed]

FINDINGS: Standard nonenhanced exam performed. No mass. No hydrocephalus. No
hemorrhage. The orbits and paranasal sinuses are clear. No acute bony
abnormality.
IMPRESSION: No acute abnormality.

## 2014-10-02 NOTE — H&P (Signed)
PATIENT NAME:  [name], [name] MR#:  [id number] DATE OF BIRTH:  [birth year]  DATE OF ADMISSION:  02/02/2012  REFERRING PHYSICIAN: ER physician, Dr. Corky Downs PRIMARY CARE PHYSICIAN: Dr. Lovie Macadamia  HEPATOLOGIST:  At [facility]  CHIEF COMPLAINT: Altered mental status, hyperglycemia.   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 They treated him for that and sent him home. Patient was almost back to baseline yesterday/last night. This morning he was confused and called his daughter who went over to check on him and found him to be confused and having asterixis. She also checked his sugar and it was more than 500. She called the patient's PCP who ordered extra insulin to be given to the patient. Subsequently she called EMS and had him brought to the Emergency Room. Patient takes 20 grams of lactulose a day for his hepatic encephalopathy. He was on rifaximin but stopped several months ago because of financial reasons/cost. The patient's hepatologist at [facility] has recommended that he restart it. He is on the list for transplant at [facility].   PAST MEDICAL HISTORY:  1. History of hepatic encephalopathy. Patient is on list for transplant at [facility] 2. Insulin-dependent diabetes mellitus. 3. 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   PAST SURGICAL HISTORY:  1. History of inguinal hernia repair. 2. Pyloromyotomy.   ALLERGIES: No known drug allergies.   MEDICATIONS:  1. Aldactone 100 mg daily.  2. Colchicine 0.6 mg daily.  3. Lasix 40 mg daily.  4. Lantus daily. 5. Lactulose 20 grams daily.  6. Reglan 10 mg as needed. 7. Paxil 20 mg daily. 8. Xifaxan 550 mg daily. Patient has not taken Xifaxan for some time now. 9. Omeprazole 20 mg daily.    FAMILY HISTORY: Both parents  were alcoholics.   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 Currently he has quit. There is no history of smoking or drug abuse. He lives alone but his daughter lives nearby.   REVIEW OF SYSTEMS: CONSTITUTIONAL: Patient is oriented only to self and person. He is having difficulty concentrating. He is not able to provide a reliable review of systems.   PHYSICAL EXAMINATION:  VITAL SIGNS: Temperature 97.3, heart rate 99, respiratory rate 18, blood pressure 109/63, pulse oximetry 97% on room air.   GENERAL: Patient is an elderly Caucasian male obese lying in bed, not in acute distress.   HEAD: Atraumatic, normocephalic.   EYES: There is pallor. No icterus or cyanosis. Pupils are equal, round, and reactive to light and accommodation. Extraocular movements are intact.  ENT: Wet mucous membranes. No oropharyngeal erythema or thrush.   NECK: Supple. No masses. No JVD. No thyromegaly. No lymphadenopathy.   CHEST WALL: No tenderness to palpation. Not using accessory muscles of respiration. No intercostal muscle retractions.   LUNGS: Bilateral basal crepitus. No wheezing or rhonchi.   CARDIOVASCULAR: S1, S2 regular. No murmur, rubs, or gallops.   ABDOMEN: Soft, distended. No guarding. No rigidity. Normal bowel sounds.   SKIN: No rashes or lesions.   PERIPHERIES: Trace pedal edema, 1+ pedal pulses.   MUSCULOSKELETAL: No cyanosis or clubbing.   NEUROLOGICAL: Patient is confused. He is slow to respond, having difficulty concentrating. He is only oriented to self and person. Nonfocal neurological exam. Cranial nerves grossly intact.   PSYCH:  Normal mood and affect.   LABORATORY, DIAGNOSTIC, AND RADIOLOGICAL DATA: Urinalysis shows no evidence of infection. White count 4.5, hemoglobin 7.8, hematocrit 23.8, platelet count 50, glucose 322, BUN 35, creatinine 2.08, sodium 135, potassium 4.8, chloride 98, CO2 26, calcium 8.4, bilirubin 1.2. Rest of LFTs are  normal. Cardiac enzymes are negative. Plasma ammonia 102.    ASSESSMENT AND PLAN: 74 year old male with past medical history of cirrhosis, insulin-dependent diabetes mellitus presents with confusion, hyperglycemia.  1. Hepatic encephalopathy. Patient's plasma ammonia is elevated. He was recently at [facility] for similar symptoms and was treated and discharged from the Emergency Room. Will admit the patient to the hospital and increase the dose of his lactulose until he has several bowel movements. Will also restart his rifaximin. Will recheck an ammonia level in a.m.  2. Insulin-dependent diabetes mellitus. Patient's Accu-Chek was more than 500 this a.m. as per the daughter and she had him extra doses of insulin. Currently his Accu-Chek is around 322. Will continue insulin sliding scale and place on ADA diet and restart Lantus. Will check a hemoglobin A1c.   3. 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 Patient/daughter deny any bleeding.  4. Anemia. Patient's blood count currently is lower than his prior, however, the daughter denies any melena, bleeding per rectum or hematemesis. He is also hemodynamically stable therefore will monitor his hemoglobin and hematocrit closely. Will not transfuse any blood for now.  5. Acute renal failure on chronic kidney disease. Patient's creatinine was 1.63. This could be related to severe hyperglycemia and dehydration. Will hold his diuretics. Will give very gentle IV fluid hydration. Monitor strict ins and outs. Avoid nephrotoxic medications and recheck a creatinine in the a.m.  6. Hyponatremia possibly due to diuretic use and also fluid overload from cirrhosis. Will monitor his sodium level closely since patient is confused.  7. Will place on GI and DVT prophylaxis.   Reviewed old medical records, discussed with the ED physician, discussed with the patient's daughter the plan of care and management.   TIME SPENT: 75 minutes.    ____________________________ Cherre Huger, MD sp:cms D: 02/02/2012 18:42:37 ET T: 02/03/2012 05:27:16 ET JOB#: [id number]  cc: Cherre Huger, MD, <Dictator> Youlanda Roys. Lovie Macadamia, MD Cherre Huger MD ELECTRONICALLY SIGNED 02/03/2012 13:06

## 2014-10-02 NOTE — Discharge Summary (Signed)
PATIENT NAMESTAN, [name] MR#:  [id number] DATE OF BIRTH:  [birth year]  DATE OF ADMISSION:  02/02/2012 DATE OF DISCHARGE:  02/05/2012  PRIMARY CARE PHYSICIAN: Dr. Lovie Macadamia  PRIMARY GASTROENTEROLOGIST: At [facility]   CHIEF COMPLAINT: Altered mental status, hypoglycemia.   DISCHARGE DIAGNOSES:  1. Hepatic encephalopathy. 2. Uncontrolled diabetes. 3. Cirrhosis. 4. Acute on chronic renal failure. 5. Thrombocytopenia from cirrhosis, chronic in nature.  6. Anemia likely iron deficiency and chronic disease.  7. History of hepatic encephalopathy, on the list for liver transplant at [facility].  8. Insulin-dependent diabetes mellitus.  9. Chronic anemia and thrombocytopenia secondary to cirrhosis.   DISCHARGE MEDICATIONS:  1. Iron sulfate 325 mg 2 times a day with meals. 2. Lantus 60 units at bedtime. 3. Paxil 10 mg daily.  4. Metoclopramide 10 mg 4 times a day as needed before meals and bedtime. 5. Xifaxan 550 mg 1 tab 2 times a day.  6. Tradjenta 5 mg daily.  7. NovoLog sliding scale insulin. 8. Spironolactone 200 mg daily.  9. Lactulose 30 mL 2 times a day, hold if greater than three loose bowel movements a day.  10. Lasix 80 mg 1 tab 2 times a day.   DIET: Low sodium, consistent carb diet.   ACTIVITY: As tolerated.   FOLLOW UP: Please follow up with her primary care physician within one week for a CBC and BMP check. Please follow up with your [facility] gastroenterologist within 1 to 2 weeks.   HISTORY OF PRESENT ILLNESS: For full because of history and physical, please see the dictation on 02/02/2012 by Dr. Karsten Fells, but briefly this is a 74 year old male with history of cirrhosis, hepatic encephalopathy who was in [place] ER on Saturday several days prior to admission for hepatic encephalopathy, was treated and went home. Patient stayed with has daughter who did not know how to administer his insulin and patient developed hyperglycemia as well as off and on confusion. Here he was found to have  ammonia level greater than 100. He has also been noncompliant with rifaximin secondary to cost. He was admitted to the hospitalist service for further evaluation and management.   LABORATORY, DIAGNOSTIC AND RADIOLOGICAL DATA: BUN on arrival 35, creatinine 2.08, sodium 131. Ammonia on arrival 102. LFTs: Total bilirubin 1.2, albumin 2.9. Troponin negative. Initial hemoglobin 7.8 on arrival, platelets 50 on arrival. Day of discharge hemoglobin 7.4, hematocrit 22.5, platelets 43. Urinalysis not suggestive of infection. Occult feces blood positive. Iron studies shows iron serum 37, iron binding capacity 399 and ferritin 12. Last ammonia level 29 as of 08/22 and hemoglobin A1c 9 from 08/21. X-ray of the chest, PA and lateral on arrival showing no acute disease of the chest.   HOSPITAL COURSE: Patient was admitted to the hospitalist service and started on every six hours lactulose and ammonia was trended which trended down. Patient was also started on rifaximin which he had ben noncompliant. The ammonia trended down nicely and patient's altered mental status improved dramatically. He can resume his b.i.d. lactulose as an outpatient. He was started on sliding scale insulin and Lantus here. Patient did have labile blood sugars and hemoglobin A1c was elevated at night and patient needs further up titration of his insulin regimen as an outpatient. On arrival, he was noted to have an acute on chronic renal failure as well and his diuretics were held and he was started on some gentle IV fluids with improvement. He can go back on his outpatient diuretic therapy which he needs for control of  his ascites and volume overload state. His thrombocytopenia appeared to be stable. He did have anemia which appears to be worse than prior. Iron studies were sent which is somewhat consistent with iron deficiency anemia given that ferritin is relatively low iron and TIBC slightly on the higher side of normal. He was started on iron tablets  and GI was consulted who recommended outpatient follow up. There is no acute indication for transfusion at this time. He was told to follow up with his primary care physician and check a CBC within a week.   DISPOSITION: Home with home health  CODE STATUS: FULL CODE.   TOTAL TIME SPENT: 35 minutes.  ____________________________ Vivien Presto, MD sa:cms D: 02/05/2012 13:49:52 ET T: 02/05/2012 13:59:18 ET JOB#: [id number]  cc: Vivien Presto, MD, <Dictator> Youlanda Roys. Lovie Macadamia, MD  Vivien Presto MD ELECTRONICALLY SIGNED 02/05/2012 15:40

## 2014-10-02 NOTE — Consult Note (Signed)
PATIENT NAME:  [name], [name] MR#:  [id number] DATE OF BIRTH:  1941/04/07  DATE OF CONSULTATION:  02/04/2012  REFERRING PHYSICIAN:   CONSULTING PHYSICIAN:  Jill Side, MD  REASON FOR CONSULTATION: Mental confusion.   HISTORY OF PRESENT ILLNESS: This is a 74 year old male with history of cirrhosis, insulin dependent diabetes, history of esophageal varices, portal hypertension, ascites, and hepatic encephalopathy. The patient is on the transplant list at [facility] according to the patient's daughter. Apparently the patient was brought to the hospital the day before yesterday with increasing mental confusion. His blood sugar was high at about 500 and serum ammonia was elevated to more than 120. The patient was started on lactulose and Rifaximin. Apparently the patient has not been using Rifaximin at home due to cost issues. As of today he feels much better. He is much more awake and alert. He is still having some trouble finding appropriate words and he is somewhat slow in his response. According to the daughter, the patient is almost back to the baseline but not quite. No other symptoms such as nausea, vomiting, fever, chills, abdominal pain, diarrhea, constipation, or hematochezia was reported by the patient. The patient was also known noticed to be anemic. He has history of chronic anemia, although the blood count was somewhat lower than his baseline. His hemoglobin and hematocrit seem to be stable since admission though around 7.6.   PAST MEDICAL HISTORY:  1. History of cirrhosis of liver most likely secondary to alcohol abuse in the past. The patient has been followed by [facility] for many years and has had a liver biopsy several years ago.  2. Portal hypertension. 3. Ascites. 4. Hepatic encephalopathy.  5. History of esophageal varices status post banding. Last upper GI endoscopy was about a year ago and, according to the daughter, no varices were found.   6. History of chronic thrombocytopenia.   7. The patient had a colonoscopy about three years ago which was unremarkable.   ALLERGIES: None.   MEDICATIONS AT HOME:  1. Aldactone 100 mg a day. 2. Colchicine 0.6 mg a day.  3. Lasix 40 mg a day. 4. Lantus daily. 5. Lactulose daily. 6. Reglan 10 mg as needed. 7. Xifaxan 550 b.i.d., which the patient is not taking on a regular basis.  8. Omeprazole 20 mg a day.  9. Apparently the patient was supposed to be on iron replacement as well but has not been taking it as well.   FAMILY HISTORY: Quite unremarkable.   SOCIAL HISTORY: Has an extensive history of alcohol abuse in the past but he is now sober.   REVIEW OF SYSTEMS: Grossly negative except for what is mentioned in the history of present illness.   PHYSICAL EXAMINATION:   GENERAL: Well built male, pale, does not appear to be in any acute distress, somewhat slow in his response but otherwise quite awake and alert.   VITAL SIGNS: Temperature 98.4, pulse 86, respirations 17, blood pressure 94/52.   HEENT: Grossly unremarkable except for anemia.   NECK: Neck veins are flat.   LUNGS: Grossly clear to auscultation bilaterally.   CARDIOVASCULAR: Regular rate and rhythm.   ABDOMEN: Somewhat distended. Some shifting dullness was noted. No hepatosplenomegaly was noted. Bowel sounds are sluggish but positive. No other acute intraabdominal pathologies were noted.   EXTREMITIES: Without edema.   NEUROLOGIC: Mild asterixis but otherwise quite unremarkable.   LABORATORY, DIAGNOSTIC, AND RADIOLOGICAL DATA: White cell count 3.7, hemoglobin 7.6, hematocrit 22.5, platelet count 44. These numbers have been stable  since yesterday. Creatinine was 2.5 on admission, is down to 1.53. BUN is 27. Serum ammonia was 102 on admission and is now down to 29 which is normal. Iron saturation is low at 9%. Serum iron is low at 37. PT and INR has not been done. Stool for occult blood is positive.   Chest x-ray unremarkable.   ASSESSMENT AND PLAN:   1. The patient is with what appears to be another episode of hepatic encephalopathy in a patient with known cirrhosis of liver. The patient seems to be improving on lactulose and rifaximin. Most likely the hepatic encephalopathy was induced by poor compliance with his medications. The patient also is anemic and there are no signs of active GI blood loss. He is heme positive from below without any melena or hematochezia. 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  2. Thrombocytopenia. 3. Ascites. 4. Portal hypertension.   RECOMMENDATIONS: I would continue lactulose and Rifaximin. The patient has been instructed to continue taking Rifaximin at home as well. I have also encouraged the patient to restart his iron supplementation. His hemoglobin and hematocrit seem to be stable and he is asymptomatic and, therefore, I do not believe blood transfusion is required at this point. Follow hemoglobin and hematocrit and transfuse if hemoglobin drops below 7. The patient has significantly improved in terms of his hepatic encephalopathy and may be able to be discharged within the next 24 hours on Rifaximin, iron supplementation, and lactulose. The patient has been encouraged to follow-up with [facility] soon after he is discharged. Will follow while he is in the hospital.   ____________________________ Jill Side, MD si:drc D: 02/04/2012 11:46:40 ET T: 02/04/2012 12:10:24 ET JOB#: [id number]  cc: Jill Side, MD, <Dictator> Jill Side MD ELECTRONICALLY SIGNED 02/05/2012 16:24

## 2014-10-05 NOTE — Discharge Summary (Signed)
PATIENT NAME:  [name], BUSHEY MR#:  [id number] DATE OF BIRTH:  1940-08-18  DATE OF ADMISSION:  01/05/2013 DATE OF DISCHARGE:  01/08/2013  DISCHARGE PLAN: The patient is being transferred to [facility] for further management.  DISCHARGE DIAGNOSES:  1.  Hypotension. 2.  Ascites. 3.  Liver cirrhosis, alcoholic.  3.  Esophageal variceal bleed.  4.  Anemia due to blood loss.  5.  Chronic thrombocytopenia.  6.  Diabetes mellitus.   CONDITION ON DISCHARGE:  Critical.  CODE STATUS:  FULL CODE.     MEDICATIONS ADVISED ON DISCHARGE:  1.  Insulin short-acting as per sliding scale coverage for blood sugar 3 times a day.  2.  Ondansetron 4 mg every 4 hours as needed for nausea and vomiting.  3.  Ceftriaxone 1 gram every 24 hours.  4.  Dopamine IV drip to keep systolic blood pressure more than 80.  5.  Lactulose 10 grams/15 mL oral, give 20 mL 3 times a day.  6.  Rifaximin 550 mg oral tablet 2 times a day.  7.  Pantoprazole intravenous continuous drip.  8.  Octreotide 50 mcg per hour IV drip.  HOME OXYGEN ON DISCHARGE: Yes. Oxygen delivery 1 liter nasal cannula oxygen supplementation.   DIET: Carbohydrate-controlled liquid diet as tolerated.   ADVICE ON DISCHARGE: He is being transferred to [facility] for further care. For further instructions, follow instructions given by [place] physician at the time of discharge.  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  He had bleeding started at 5:00 p.m. on the 24th of July, very large episode and lots of blood through rectum, and approximately he said it would be 1/2 pint blood loss. There was no pain, no further bowel movement after coming to the ER, and he was feeling sleepy; and as per him, the color of the blood in the stool was just bright red. He also had ascites, and that was increasing in size. He had gained at  least 9 pounds weight, and he had paracentesis of 6 liters on the 23rd of June. The patient's blood pressure in the Emergency Room on presentation was ranging from 70 to 80 systolic.  He had extensive history of hepatic encephalopathy and multiple admissions in [facility] and at [facility] in the past. He had variceal bleed and varices were banded at [facility] 4 times in the past, and he was in the hospital, [facility], a week ago before this admission. At that time he came with a similar episode of bleed, and rebanding  of his varices were done as all the clamps on the varices were off.  Due to his active bleeding and borderline hypotension, he was admitted to the CCU for further management.   HOSPITAL COURSE: EGD repeat was done in this admission which showed esophageal band which was placed last week had fallen off, and possibly it might have bled from the same side.  A new band was placed, and Gastroenterology suggested to start him on a clear liquid diet while continuing on octreotide drip and dopamine drip for pressure support and also advised to resume lactulose and rifaximin as he was taking it for his hepatic encephalopathy prevention.  This time, the patient remained in the CCU, and his blood pressure was running constantly in the 80s, and as soon as we tried to taper down dopamine drip or take it off, blood  pressure was dropping to 70s, and so he was maintained on low-dose dopamine drip throughout the hospital course.  He tolerated liquid diet very well, and he had 1 bowel movement in 48-hours stay in hospital and that was dark stool, possibly it might be old blood. His hemoglobin, which was around 9 on presentation, came down to 7.8 at the time of discharge, and so overall remained stable. The patient remained fully alert and oriented, and there was no concern of hepatic encephalopathy during this hospital admission. As he had increasing ascites, he was also started on Rocephin 1 gram  IV daily as a precaution measure due to his overall very poor health status for SBP without confirmation. After 2 days in the hospital stay, the patient started developing some discomfort in his breathing due to his increasing ascites. Ultrasound of the abdomen was done which showed moderate sized ascites. As it was a weekend, and due to limited radiologic coverage and service availability, plus the patient being  critical case on dopamine drip, platelet count of 34, and INR of 1.5, radiologist was not feeling comfortable to do paracentesis on the weekend when he does not have any backup.  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  We called the Transfer Center, and the patient was accepted by [facility] for further management.   Other medical issues addressed in this hospital stay:  1.  Acute on chronic anemia:  His hemoglobin was already remaining around 9, and he presented with that and gradually went down to 7.8 while in the hospital.  2.  Liver cirrhosis and ascites: The patient was taking Aldactone and Lasix, but in the hospital we held it due to his borderline low blood pressure.  3.  History of hepatic encephalopathy:  Once we were able to start him on diet after endoscopy, we resumed his lactulose and rifaximin, and during this hospital stay he remained fully alert and oriented. No encephalopathy was noted.  4.  Diabetes: As the patient was only taking liquid diet over here, we held his Lantus and just continued on insulin sliding scale coverage.  IMPORTANT LABORATORY RESULTS:  On the 24th of July on presentation, total WBC count is 3.7, hemoglobin was 9.6 and platelet count was 50,000. MCV was 100. His creatinine was 1.87 and BUN was 26, potassium was 4.1, CO2 28, chloride  104.   Alkaline phosphate 109, SGPT 28, SGOT 41, total bilirubin 1.3, total protein 7.1, albumin 2.8. INR 1.5. Prothrombin time 17.8.  Chest x-ray: Mild bilateral interstitial thickening, mild interstitial edema. No focal parenchymal opacity or pleural effusion. Urinalysis was negative on presentation. Hemoglobin was 9 on the 25th of July, and creatinine improved to 1.77. Hemoglobin dropped to 8.8 on the 26th of July.  Ultrasonogram of abdomen showed a small-to-moderate amount of ascites on the 26th of July. Hemoglobin dropped to 7.8 on the 27th of July, and creatinine improved to 1.72 on the 27th of July.  Repeat hemoglobin later on the 27th of July came 8.1.   TOTAL TIME SPENT ON THIS DISCHARGE: 45 minutes.   ____________________________ Ceasar Lund Anselm Jungling, MD vgv:cb D: 01/08/2013 16:29:45 ET T: 01/08/2013 17:35:07 ET JOB#: [id number]  cc: Ceasar Lund. Anselm Jungling, MD, <Dictator> Vaughan Basta MD ELECTRONICALLY SIGNED 01/17/2013 23:17

## 2014-10-05 NOTE — Consult Note (Signed)
Pt seen and examined. Full consult to follow. Pt followed at [facility]. Recurrent admissions for hepatic encephalopathy. Daughter not available. Pt fairly alert this AM. No asterixis. No abd pain or tenderness with ascites. Claims to be compliant with meds. Continue xifaxan bid and lactulose every 6 hrsregularly until ammonia levels comes down.Make sure we give him the right dose he gets at home. Once it does, then patient can be discharged to home with f/u at [facility]. Thanks.   Electronic Signatures: Verdie Shire (MD) (Signed on 28-Jun-14 11:09)  Authored   Last Updated: 28-Jun-14 11:11 by Verdie Shire (MD)

## 2014-10-05 NOTE — H&P (Signed)
PATIENT NAME:  [name], [name] MR#:  [id number] DATE OF BIRTH:  [birth year]  DATE OF ADMISSION:  12/10/2012  PRIMARY CARE PHYSICIAN: Youlanda Roys. Lovie Macadamia, MD  REFERRING PHYSICIAN: Conni Slipper, MD  CHIEF COMPLAINT: Confusion.   HISTORY OF PRESENT ILLNESS: Mr. Juenger is a 74 year old male with known history of cirrhosis, hepatic encephalopathy. Had multiple admissions in the last few months for hepatic encephalopathy. Per daughter, the patient is quite compliant with the medication. However, the patient had a recent paracentesis done in the last 1 week, removed about 6 liters of fluid. However, the patient was given albumin after the procedure. The patient started to have confusion since yesterday and continued to get worse. Concerning this, the patient is brought to the Emergency Department. The history is mainly obtained from the patient's daughter. Per daughter, the patient does not have any cough, shortness of breath, did not complain of any abdominal pain, nausea, vomiting or diarrhea. The patient's previous admission was on 11/26/2012, and at that time, the hepatic encephalopathy attributed to variceal banding. The patient is found to have elevated ammonia level. No obvious source of infection is found.   PAST MEDICAL HISTORY:  1. Alcoholic cirrhosis of the liver.  2. Diabetes mellitus, insulin dependent.  3. Esophageal variceal banding at [facility].  4. Status post spinal stenosis repair.  5. Hernia repair.  6. 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  7. Chronically elevated LFTs.   ALLERGIES: No known drug allergies.   HOME MEDICATIONS:  1. Xifaxan 550 mg 2 times a day.  2. Vitamin E 400 units 2 times a day. 3. Spironolactone 200 mg orally once a day.  4. Paxil 10 mg once a day.  5. Omeprazole 40 mg once a day.  6. Lantus 56 units once a day.  7. Lactulose orally 4 tablespoons 3 to 4 times a day.  8. Lasix 1 tablet 80 mg once a day.  9. Ferrous sulfate 325 mg  2 times a day.   SOCIAL HISTORY: Lives at home with his daughter. Currently, no history of alcohol or drug use. Previous history of alcohol use.   FAMILY HISTORY: Both parents were alcoholics.    REVIEW OF SYSTEMS: Could not be obtained from the patient secondary to confusion.   PHYSICAL EXAMINATION:  GENERAL: This is a well-built, well-nourished, age-appropriate male lying down in the bed, not in distress.  VITAL SIGNS: Temperature 98.8, pulse 72, blood pressure 110/50, respiratory rate of 16, oxygen saturation is 95% on room air.  HEENT: Head normocephalic, atraumatic. Eyes: No sclerae icterus. Conjunctivae normal. Pupils equal and reactive to light. Could not examine the extraocular movements. Mucous membranes: Mild dryness. No pharyngeal erythema.  NECK: Supple. No lymphadenopathy. No carotid bruit.   CHEST: No focal tenderness.  LUNGS: Bilaterally clear to auscultation.  HEART: S1 and S2 regular. No murmurs are heard.  ABDOMEN: Bowel sounds present. Soft. Has fluid shift. Nontender.  EXTREMITIES: Pedal pulses 2+.  SKIN: No rash or lesions.  MUSCULOSKELETAL: Good range of motion in all the extremities.  NEUROLOGIC: The patient is quite confused, oriented to self and daughter, not to place and time. Motor 5/5 in upper and lower extremities.   LABORATORY DATA: BMP: BUN 36, creatinine of 1.68, the rest of all the values are within normal limits. Ammonia level of 184. CBC: WBC 4, hemoglobin 8.9, platelet count of 49.   ASSESSMENT AND PLAN: Mr. Gleaves is a 74 year old with known history of cirrhosis with multiple admissions for hepatic encephalopathy, who  comes again with another episode of confusion.   1. Hepatic encephalopathy. Per daughter, the patient is quite compliant with diet and medications. Uncertain about the precipitating factor during this time. Even though the patient had recent paracentesis, which was therapeutic, the patient does not have any abdominal pain or tenderness.  Does not have any respiratory symptoms. Currently, urinalysis is pending. However, will keep the patient on Rocephin. Continue with lactulose and follow up.  2. Diabetes mellitus. Will decrease the dose of the insulin as the patient may not be taking as much diet as at home. Continue also with the Accu-Cheks.  3. Ascites. The patient had recent paracentesis done. Currently, does not seem to have any worsening of any distress from the current ascitic fluid. Will continue to watch.  4. Pancytopenia secondary to cirrhosis.  6. Keep the patient on deep vein thrombosis prophylaxis with SCDs.   TIME SPENT: 50 minutes.   ____________________________ Monica Becton, MD pv:OSi D: 12/10/2012 07:44:01 ET T: 12/10/2012 08:34:16 ET JOB#: [id number]  cc: Monica Becton, MD, <Dictator> Youlanda Roys. Lovie Macadamia, MD Monica Becton MD ELECTRONICALLY SIGNED 12/21/2012 21:47

## 2014-10-05 NOTE — Consult Note (Signed)
Chief Complaint:  Subjective/Chief Complaint Seen for fu GI bleed.  Patient doing well, no evidnece of recurrent bleeding, no n/v or abdominal pain.  No bm.   VITAL SIGNS/ANCILLARY NOTES: **Vital Signs.:   19-Jul-14 13:33  Vital Signs Type Routine  Temperature Temperature (F) 98.4  Celsius 36.8  Temperature Source oral  Pulse Pulse 62  Respirations Respirations 18  Systolic BP Systolic BP 016  Diastolic BP (mmHg) Diastolic BP (mmHg) 60  Mean BP 74  Pulse Ox % Pulse Ox % 97  Pulse Ox Activity Level  At rest  Oxygen Delivery Room Air/ 21 %   Brief Assessment:  Cardiac Regular   Respiratory clear BS   Gastrointestinal details normal distended/ascites with fluid wave.  non-tender, bs positive.   Lab Results: Routine Chem:  16-Jul-14 12:36   BUN  42  17-Jul-14 07:42   BUN  40  18-Jul-14 05:30   BUN  35  Routine Hem:  18-Jul-14 05:30   Hemoglobin (CBC)  8.1  Platelet Count (CBC)  44  19-Jul-14 05:38   WBC (CBC)  2.1  RBC (CBC)  2.30  Hemoglobin (CBC)  7.7  Hematocrit (CBC)  22.9  Platelet Count (CBC)  43  MCV 100  MCH 33.6  MCHC 33.6  RDW  23.0  Neutrophil % 57.2  Lymphocyte % 15.0  Monocyte % 18.0  Eosinophil % 8.6  Basophil % 1.2  Neutrophil #  1.2  Lymphocyte #  0.3  Monocyte # 0.4  Eosinophil # 0.2  Basophil # 0.0 (Result(s) reported on 31 Dec 2012 at 06:20AM.)   Assessment/Plan:  Assessment/Plan:  Assessment 1) upper gi bleeding/melena from variceal banding site-stable, not recurrent.  On octreotide and ppi drips, would continue for now.  2) cirrhosis-ascites, coagulopathy, thrombocytopenia, anemia--history of etoh use.   Plan 1) continue current, would do daily weights and strict I/O as fluid overload easy in this patient .  Continue diuretics/nadolol as you are.   Electronic Signatures: Loistine Simas (MD)  (Signed 19-Jul-14 16:28)  Authored: Chief Complaint, VITAL SIGNS/ANCILLARY NOTES, Brief Assessment, Lab Results, Assessment/Plan   Last  Updated: 19-Jul-14 16:28 by Loistine Simas (MD)

## 2014-10-05 NOTE — Consult Note (Signed)
Pt seen and examined. Please see Dawn Harrison's notes. Pt has known liver cirrhosis and esophageal varices. Had 4 bands ligated yest AM at [facility]. Bleeding started yest afternoon with drop in hgb to 5.8. Feels much better now. Though patient had not had bleeding after band ligation in the past, concern is whether one of the band slipped and that patient is bleeding from esophageal varices. If so, bleeding may continue. Even thoug patient had popsicle at 1130, will need plan EGD this afternoon as an EMERGENCY. If there is no bleeding, then we can look for other sources. Thanks.  Electronic Signatures: Verdie Shire (MD)  (Signed on 17-Jul-14 13:33)  Authored  Last Updated: 17-Jul-14 13:33 by Verdie Shire (MD)

## 2014-10-05 NOTE — Consult Note (Signed)
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  patient admitted for persistent right upper extremity weakness concernign for TIA TIA :  CT head negative for acute changes,  carotid dopplers, fasting lipids,  ECHO and MRI ordered.  ASA ordered chest pain:  abnormal QT, will cycle cardiac enzymes .  lytes  Electronic Signatures: Deborra Medina (MD)  (Signed on 26-Jul-14 21:21)  Authored  Last Updated: 26-Jul-14 21:21 by Deborra Medina (MD)

## 2014-10-05 NOTE — Discharge Summary (Signed)
PATIENT NAME:  [name], [name] MR#:  [id number] DATE OF BIRTH:  19-May-1941  DATE OF ADMISSION:  12/28/2012 DATE OF DISCHARGE:  01/01/2013  ADMITTING PHYSICIAN: Gordon Sink, MD DISCHARGING PHYSICIAN: Gordon Lighter, MD PRIMARY CARE PHYSICIAN: Gordon Pitch, MD  PRIMARY GASTROENTEROLOGIST: Is at [facility]: GI consultation with Dr. Verdie Lee while in the hospital.   DISCHARGE DIAGNOSES: 1.  Acute upper gastrointestinal bleed.  2.  Esophageal variceal bleed, status post banding.  3.  Acute on chronic anemia requiring 2 units of packed red blood cell transfusion this admission.  4.  Liver cirrhosis.  5.  Ascites.  Recently 6.5 liters drained about 2 weeks ago at [facility]  6.  Hepatic encephalopathy.  7.  Diabetes mellitus.  8.  Chronic kidney disease, stage 3. 9.  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  DISCHARGE HOME MEDICATIONS:  1.  Ferrous sulfate 325 mg p.o. b.i.d.  2.  Vitamin E 400 international units p.o. b.i.d.  3.  NovoLog insulin per sliding scale.  4.  Aldactone 200 mg p.o. daily.  5.  Paxil 10 mg p.o. daily.  6.  Glargine insulin 12 units subcutaneous at bedtime while on liquid diet.  7.  Nadolol 20 mg p.o. daily at 5:00 p.m. in the evening.  8.  Lasix 80 mg p.o. daily.  9.  Lactulose 20 mL orally 3 to 4 times a day.  10.  Rifaximin 550 mg p.o. b.i.d.  11.  Cepacol lozenges every 4 hours as needed for sore throat.  12.  Omeprazole 40 mg p.o. in the morning before breakfast.  DISCHARGE DIET: Low-sodium diet and a full-liquid diet.   DISCHARGE ACTIVITY: As tolerated.  FOLLOW-UP INSTRUCTIONS: With Dr. Candace Lee in 1 to 2 weeks.   NOTE:  The patient's insulin has been decreased to 12 units because he will be only on a liquid diet, and sugars were also within normal limits while in the hospital; so, please check sugars at home and adjust insulin accordingly.   LABORATORY AND IMAGING STUDIES PRIOR TO DISCHARGE:  WBC  2.3, hemoglobin 8.3, hematocrit 23.8, platelet count is 42. Sodium 139, potassium 4.7, chloride 109, bicarb 23, BUN 35, creatinine 1.5, glucose 98 and calcium of 8.0.  Plasma ammonia prior to discharge is slightly elevated at 45. Upper GI endoscopy done on 12/29/2012 showing ulcerated areas where bands were placed in the esophagus, however, no bands visible at this time, and one area had a large blood clot, and a repeat band has been placed on this area.   BRIEF HOSPITAL COURSE:  [name] is a 74 year old Caucasian male with past medical history significant for alcoholic liver cirrhosis with ascites, chronic pancytopenia secondary to the above, prior history of esophageal varices who usually has been followed by [facility], had an upper GI endoscopy about 2 days prior to this admission and had banding done for esophageal varices at that time. The patient comes to the hospital secondary to bloody stools and was noted to have a recurrent upper GI bleed.   1.  Upper GI bleed secondary to slipping of esophageal variceal bands:  He had a repeat  GI  endoscopy done by GI here, and they were able to find a large ulcerated clot with slipping of prior bands placed 3 days ago prior to this procedure. A repeat banding was done. The patient has not had further bleeding. He did require 2 units of packed RBC transfusion this admission. His hemoglobin has remained stable. He was  placed on octreotide and Protonix drips, and he did receive 4 days of octreotide drip while in the hospital. He is already on Lasix and Aldactone.  Nadolol is being added to be used in the evening. He is currently on a full liquid diet and was advised to remain on a full liquid diet for the next 3 days before advancing to a soft diet.  2.  Liver cirrhosis:  History of alcoholic liver cirrhosis and ascites and history of hepatic encephalopathy.  He is on Lasix and Aldactone, also on lactulose and Xifaxan for his encephalopathy. His ammonia was  30 two days ago, and it has slightly bumped up to 45. He had missed a couple of doses of lactulose while in the hospital secondary to his GI bleed, but his mental status is at baseline, and he was advised to use his lactulose at 3 to 4 times a day based on his bowel movements, so he is being discharged home.  3.  Diabetes mellitus: He is on 56 unit of Lantus at home; however, since he has been n.p.o. and only on liquid diet here in the hospital, sugars have been in the lower range, and he is currently only getting 10 units of Lantus here; so, he is being discharged on 12 units of Lantus and was advised to check his sugars and adjust the dose appropriately as his diet will be advanced.  His course has been otherwise uneventful in the hospital.   DISCHARGE CONDITION: Stable.   DISCHARGE DISPOSITION: Home.   TIME SPENT ON DISCHARGE: 40 minutes.   ____________________________ Gordon Lighter, MD rk:cb D: 01/01/2013 12:42:20 ET T: 01/01/2013 22:14:31 ET JOB#: [id number]  cc: Gordon Lighter, MD, <Dictator> [name]. Gordon Macadamia, MD [name]. Gordon Cruise, MD Gordon Lighter MD ELECTRONICALLY SIGNED 01/09/2013 18:00

## 2014-10-05 NOTE — Consult Note (Signed)
Brief Consult Note: Diagnosis: GI bleed.   Patient was seen by consultant.   Consult note dictated.   Recommend further assessment or treatment.   Discussed with Attending MD.   Comments: I have personally seen and examined [name].   [name] underwent banding on 12/26/12 at [facility] and then had bleeding, underwent another EGD 12/29/12 here by Dr Candace Cruise with 1 additional band placed and noted ulcers at site of prev band sites.  Currently, with one episode of rectal bleeding at 6 pm, and hypotension but no tachycardia at that time. Since then, bp has risen to near baseline 63'O systolic and no further rectal bleeding.  He appears quite stable and given the banding x 2 just over 1 week ago with banding of one remaining varix, suspect the yield from upper endosopy would be low.  Will monitor overnight, and peform EGD if he were to develop further bleeding. If no bleeding overnight, will consider endoscoopy again in am.  For now, cont PPI and octreotide drip. Start ceftriaxone given ascites. Monitor h/h, hemoynamics.  Call GI for further bleeding.  Dictation to follow.  Electronic Signatures: Arther Dames (MD)  (Signed 24-Jul-14 23:05)  Authored: Brief Consult Note   Last Updated: 24-Jul-14 23:05 by Arther Dames (MD)

## 2014-10-05 NOTE — Consult Note (Signed)
Chief Complaint:  Subjective/Chief Complaint No further bleeding. No BM since admission. On lactulose/xifaxan now. On clear liquid diet. Hgb stable. BP still low and remains on dopamine.   VITAL SIGNS/ANCILLARY NOTES: **Vital Signs.:   26-Jul-14 08:00  Vital Signs Type Routine  Pulse Pulse 62  Respirations Respirations 19  Systolic BP Systolic BP 79  Diastolic BP (mmHg) Diastolic BP (mmHg) 51  Mean BP 60  Pulse Ox % Pulse Ox % 96  Pulse Ox Activity Level  At rest  Oxygen Delivery 2L  Pulse Ox Heart Rate 64   Brief Assessment:  GEN no acute distress   Cardiac Regular   Respiratory clear BS   Gastrointestinal Ascties?   Lab Results: Routine Chem:  26-Jul-14 04:06   Result Comment LABS - This specimen was collected through an   - indwelling catheter or arterial line.  - A minimum of 72mls of blood was wasted prior    - to collecting the sample.  Interpret  - results with caution.  Result(s) reported on 07 Jan 2013 at 04:58AM.   Assessment/Plan:  Assessment/Plan:  Assessment Variceal bleeding. Stopped. On octreotide drip.   Plan Keep on clear liquids today. Pt was supposed to get U/S of abdomen as outpt to check for ascites but cancelled due to admission. Please order U/S while patient here. Will have to hold off on diuretics as long as BP low.   Electronic Signatures: Verdie Shire (MD)  (Signed 26-Jul-14 09:16)  Authored: Chief Complaint, VITAL SIGNS/ANCILLARY NOTES, Brief Assessment, Lab Results, Assessment/Plan   Last Updated: 26-Jul-14 09:16 by Verdie Shire (MD)

## 2014-10-05 NOTE — Consult Note (Signed)
EGD done. 4 ulcerated areas in esophagus. Looks all the 4 bands places yest AM have passed already. No active bleeding. However, large blood clot in upper most banded area. To prevent bleeding from this area, new band placed. 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 Keep patient NPO except meds rest of today. Continue octreotide/protonix drip. Moniter hgb closely.   Electronic Signatures: Verdie Shire (MD) (Signed on 17-Jul-14 14:30)  Authored   Last Updated: 17-Jul-14 14:34 by Verdie Shire (MD)

## 2014-10-05 NOTE — H&P (Signed)
PATIENT NAME:  [name], [name] MR#:  [id number] DATE OF BIRTH:  17-Nov-1940  DATE OF ADMISSION:  11/09/2012  REFERRING PHYSICIAN:  Dr. Charlesetta Ivory.   PRIMARY CARE PHYSICIAN:  Dr. Juluis Pitch.   PRIMARY HEPATOLOGIST:  Dr. Tamala Julian at [facility].   CHIEF COMPLAINT:  Confusion.   HISTORY OF PRESENT ILLNESS:  [name] is a 74 year old white male with history of diabetes mellitus, cirrhosis secondary to alcohol use, with previous history of hepatic encephalopathy, currently on transplant list at [facility], follows up at [facility] with a Dr. Tamala Julian, comes to the Emergency Department with confusion of two days duration.  The patient has been closely followed up at [facility].  They have been trying to titrate the diuretics.  Started to notice somewhat confusion since yesterday.  Considering this, called the [facility] who recommended the patient to be given lactulose.  The patient was given lactulose, multiple doses yesterday.  The patient had about 20 bowel movements.  Despite that, patient's condition continues to get worse.  Concerning this, this patient is brought to the Emergency Department.  The patient complained of some abdominal pain, however currently denies having any abdominal pain.  Did not have any fever.  No cough.  No change in recent sedative medication.  Work-up in the Emergency Department reveals the patient's ammonia level of 80.   PAST MEDICAL HISTORY: 1.  Hepatic encephalopathy.  2.  Cirrhosis, on the transplant list at [facility].  3.  Insulin-dependent diabetes mellitus.  4.  Chronic anemia.  5.  Thrombocytopenia due to cirrhosis.   PAST SURGICAL HISTORY:  1.  Inguinal hernia repair.  2.  Pyloromyotomy.   ALLERGIES:  No known drug allergies.   HOME MEDICATIONS: 1.  Rifaximin 550 mg 1 tablet 2 times a day.  2.  Triginta 5 mg 1 tablet once a day.  3.  Spironolactone 100 mg every other day and 150 mg other days.  4.  Paxil 10 mg once a day.  5.  Insulin NovoLog on sliding scale insulin.   6.  Metoclopramide 10 mg 4 times a day.  7.  Lactulose 10 grams 2 times a day.  8.  Lantus 57 units once daily.  9.  Lasix 60 mg oral once daily.  10.  Ferrous sulfate 325 mg 1 tablet twice daily.   SOCIAL HISTORY:  Smoked in the past.  Currently denies drinking alcohol, however drank heavily in the past.  Denies using any illicit drugs.  Currently lives with his daughter.  Retired.   FAMILY HISTORY:  Both parents were alcoholics.   REVIEW OF SYSTEMS:  Unable to obtain any review of systems secondary to patient's confusion.   PHYSICAL EXAMINATION: GENERAL:  This is a well-built, well-nourished, looks somewhat older than his stated age, lying down in the bed, not in distress, confused.  VITAL SIGNS:  Temperature 98.5, pulse 79, blood pressure 116/49, respiratory rate of 20, oxygen saturation is 97% on room air.  HEENT:  Head normocephalic, atraumatic.  Eyes, no scleral icterus.  Conjunctivae normal.  Pupils equal and react to light.  Mucous membranes dry.  NECK:  Supple.  No lymphadenopathy.  No JVD.  No carotid bruit.  CHEST:  Has no focal tenderness.  LUNGS:  Bilaterally clear to auscultation.  HEART:  S1 and S2 regular.  No murmurs are heard.  No pedal edema.  Pulses 2+ in dorsalis pedis and posterior tibialis.  ABDOMEN:  Obese.  Bowel sounds plus.  Soft.  Could not appreciate hepatosplenomegaly.  Did  not notice any fluid shift.  SKIN:  No rash or lesions.  MUSCULOSKELETAL:  Good range of motion in all the extremities.  LYMPHATIC:  No inguinal, axillary lymphadenopathy.  NEUROLOGIC:  The patient is alert, oriented to self and to person, but not to place and time.  No apparent cranial abnormalities.  Moving all four extremities.  I could not examine the sensory.   LABORATORY DATA:  CBC, WBC of 4.1, hemoglobin 8.3, platelet count of 58.  Complete metabolic panel is BUN 37, creatinine of 1.56.  The rest of all the values are within normal limits.  The troponin less than 0.02.  Ammonia 80.    EKG, 12-lead:  Normal sinus rhythm with no ST-T wave abnormalities.   ASSESSMENT AND PLAN:  [name] is a 74 year old with known history of cirrhosis and a previous history of hepatic encephalopathy comes to the Emergency Department with confusion.  1.  Hepatic encephalopathy, despite having multiple bowel movements, the patient continues to be confused.  We will also obtain CT head without contrast.  Continue with lactulose, rifaximin.  The cause for the hepatic encephalopathy is quite uncertain at this time.  We will also continue with gentle hydration. 2.  Diabetes mellitus, insulin-dependent.  Decrease the dose of the Lantus from 57 units to 50 units and continue with sliding scale insulin.  3.  Anemia.  The patient has hemoglobin of 8.3 which is better from the last admission.  No current indication for any transfusion.   4.  Keep the patient on deep vein thrombosis prophylaxis with SCDs.  Time spetn 50 min.   ____________________________ Monica Becton, MD pv:ea D: 11/10/2012 04:17:36 ET T: 11/10/2012 06:02:23 ET JOB#: [id number]  cc: Monica Becton, MD, <Dictator> Monica Becton MD ELECTRONICALLY SIGNED 11/11/2012 7:32

## 2014-10-05 NOTE — Discharge Summary (Signed)
PATIENT NAMERIVERS, [name] MR#:  [id number] DATE OF BIRTH:  Feb 07, 1941  DATE OF ADMISSION:  11/16/2012 DATE OF DISCHARGE:  11/20/2012  PRIMARY CARE PHYSICIAN: Dr. Juluis Pitch.    CHIEF COMPLAINT: Confusion.   DISCHARGE DIAGNOSES: 1.  Hepatic encephalopathy in the setting of medication noncompliance.  2.  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  3.  Pancytopenia.  4.  Diabetes.  5.  Chronic kidney disease.  6.  Recent hospitalizations for hepatic encephalopathy.  7.  Chronic anemia.   DISCHARGE MEDICATIONS: Metoclopramide 10 mg 4 times a day as needed before meals and at bedtime, Xifaxan 550 mg 2 times a day, NovoLog sliding-scale insulin, ferrous sulfate 325 mg 1 tablet 2 times a day, Lasix 60 mg; please alternate with Lasix 40 mg every other day, Paxil 10 mg daily, spironolactone 100 mg, and please alternate with 150 mg every other day, Lactulose 30 mg 3 times a day, and keep between 3 to 5 loose stools per day.   DIET: Low sodium.   ACTIVITY: As tolerated.   FOLLOWUP: Please follow with Laverne within 1 to 2 weeks. Please follow with PCP within 1 week.   DISPOSITION: Home.   The patient is FULL CODE.   Significant labs and imaging: For full details of the significant labs and imaging: Initial ammonia was 60; last ammonia 53. Peak ammonia 105.   Initial creatinine 1.32. LFTs showed AST 54, albumin 2.7; otherwise within normal limits. Troponin negative. Ethanol level below detection on arrival.   Initial WBC 3.5, hemoglobin 7.8, platelets 47. Blood cultures on arrival: No growth to-date. UA not suggestive of infection.   Chest x-ray, 1-view showing possible low-grade CHF.   HISTORY OF PRESENT ILLNESS AND HOSPITAL COURSE: For full details of H and P, please see the dictation on the 4th by Dr. Bridgett Larsson, but briefly this is a pleasant 74 year old male with a history of hepatic encephalopathy, liver cirrhosis, diabetes, who presents for altered mental status. He had recently  been discharged from the hospital, but apparently had not taken his rifaxamin twice a day and was only taking it daily, and came back for confusion. He was started on Lactulose and rifaxamin again, and for his confusion blood cultures were sent as well as a UA. They have been negative so far. He has had no fever and his altered mental status slowly improved. He did have a bout of increased ammonia level, but that has trended down. He appears to have a labile ammonia level, and I discussed this with his daughter, and the patient possibly needs increased bowel movements to keep a lower ammonia. He did have some increased ascites overall, however he did have pancytopenia and platelets of 1000. A unit of platelets was transfused for a possible paracentesis on Friday, but that did not happen, and over the weekend his ascites has decreased significantly. He is not short of breath and he was instructed to keep net negative balances of 0.5 liter to a liter every day and follow with GI at [facility]. At this point he will be discharged with outpatient followup.    ___________________________ Vivien Presto, MD sa:dm D: 11/20/2012 12:00:00 ET T: 11/20/2012 15:00:55 ET JOB#: [id number]  cc: Youlanda Roys. Lovie Macadamia, MD Vivien Presto, MD, <Dictator>  Vivien Presto MD ELECTRONICALLY SIGNED 12/09/2012 20:57

## 2014-10-05 NOTE — H&P (Signed)
PATIENT NAME:  [name], MCPHEETERS MR#:  [id number] DATE OF BIRTH:  April 07, 1941  DATE OF ADMISSION:  11/16/2012  PRIMARY CARE PHYSICIAN: Juluis Pitch, MD  REFERRING PHYSICIAN:   Latina Craver, MD    CHIEF COMPLAINT: Confusion since yesterday.   HISTORY OF PRESENT ILLNESS: The patient is a 74 year old Caucasian male with a history of hepatic encephalopathy, liver cirrhosis, diabetes, anemia and was brought to the ED due to confusion since yesterday. The patient is weak, alert but confused, unable to provide any information. The patient denies any symptoms. According to the patient's daughter, the patient was just discharged for hepatic encephalopathy 5 days ago. The patient was clear until yesterday when the patient developed confusion. The patient does not know his name, where he is and denies any symptoms. The patient's ammonia level increased to 60, so the patient is being admitted for hepatic encephalopathy. According to the patient's daughter, the patient has been taking home medication as scheduled. She said the patient had a bowel movement 3 to 4 times a day, per patient, but the patient's daughter is not sure. The patient has a history of liver cirrhosis, is currently on the liver transplant list at [facility].   PAST MEDICAL HISTORY: Hepatic encephalopathy, cirrhosis, on transplant list at [facility], insulin-dependent diabetes, chronic anemia, thrombocytopenia due to cirrhosis.   PAST SURGICAL HISTORY: Inguinal hernia repair, pyloromyotomy.   SOCIAL HISTORY: The patient smoked in the past and drank alcohol heavily in the past but not recently. The patient is living with his daughter.   FAMILY HISTORY: Both parents were alcoholics.   REVIEW OF SYSTEMS: Unable to obtain due to patient's confusion status.   ALLERGIES: No known drug allergies.   HOME MEDICATIONS:   1.  Rifaximin 550 mg p.o. tablets b.i.d. 2.  Tradjenta  5 mg p.o. daily.  3.  Spironolactone 150 mg every other day, 100 mg p.o. every  other day.  4.  Paxil 10 mg p.o. once a day.  5.  Sliding scale.  6.  NovoLog sliding scale.  7.  Reglan 10 mg 1 tablet 4 times a day p.r.n.  8.  Lactulose 10 grams/15 mL oral syrup 30 mL b.i.d.  9.  Lantus 56 units subcutaneous once a day.  10.  Lasix 60 mg p.o. every other day, 40 mg p.o. every other day.  11.  Ferrous sulfate 325 mg p.o. b.i.d.   PHYSICAL EXAMINATION: VITAL SIGNS: Temperature 99.2, blood pressure 113/58, pulse 93, respirations 18.  GENERAL:  The patient is awake but confused, in no acute distress.  HEENT: Pupils are round, equal and reactive to light and accommodation. No discharge from ear or nose. Mild dry oral mucosa. Clear oropharynx.  NECK: Supple. No JVD or carotid bruit. No lymphadenopathy, no thyromegaly.  CARDIOVASCULAR: S1, S2 regular rate and rhythm. No murmurs or gallops.  PULMONARY: Bilateral air entry. No wheezing or rales, no use of accessory muscles to breathe.  ABDOMEN: Soft, obese. No distention or tenderness. Bowel sounds weak.  It is difficult to estimate whether the patient has organomegaly.  EXTREMITIES: No edema, clubbing or cyanosis. No calf tenderness. Strong bilateral pedal pulses.  SKIN: No rash or jaundice.  NEUROLOGY: The patient is weak, but he does not know his name and time and place. He is confused but follows commands. No focal deficit. Power 5 out of 5. Sensation intact.   LABORATORY AND RADIOLOGICAL DATA:  Ammonia level 60. WBC 3.5, hemoglobin 7.8, platelet 47.  Glucose 207, BUN 28, creatinine 1.32, sodium  133, potassium 3.7, chloride 103, bilirubin 1.0, SGPT 41, SGOT 54, INR 1.4, ethanol level less than 3.0.  Chest x-ray worrisome low-grade CHF. EKG showed normal sinus rhythm at 85 bpm.   IMPRESSIONS:  1.  Hepatic encephalopathy.  2.  Liver cirrhosis.  3.  Hyponatremia.  4.  Dehydration.  5.  Pancytopenia.  6.  Diabetes.   PLAN OF TREATMENT: 1.  The patient will be admitted to the medical floor. We will increase the  lactulose to q. 8 hours and monitor bowel movements.  2.  We will continue rifaximin 550 mg p.o. b.i.d. and follow up ammonia level.  3.  We will continue Lasix, spironolactone and follow up BMP.  4.  For diabetes, we will start a sliding scale and continue Lantus 56 units once a day.   I discussed the patient's condition and plan of treatment with the patient daughter and daughter-in-law.   CODE STATUS:  The patient is a FULL CODE.     TIME SPENT: About 58 minutes.    ____________________________ Demetrios Loll, MD qc:cb D: 11/16/2012 16:08:36 ET T: 11/16/2012 16:33:57 ET JOB#: [id number]  cc: Demetrios Loll, MD, <Dictator> Demetrios Loll MD ELECTRONICALLY SIGNED 11/17/2012 15:02

## 2014-10-05 NOTE — Consult Note (Signed)
PATIENT NAME:  [name], [name] MR#:  [id number] DATE OF BIRTH:  12-08-1940  DATE OF CONSULTATION:  12/10/2012  CONSULTING PHYSICIAN:  Lupita Dawn. Candace Cruise, MD  REASON FOR REFERRAL: Hepatic encephalopathy.   HISTORY OF PRESENT ILLNESS: The patient is an 74 year old white male with a known history of cirrhosis and hepatic encephalopathy and ascites who has had multiple admissions in the last few months due to hepatic encephalopathy. When he was brought in he was quite confused and encephalopathic. Fortunately, when I saw him this morning, he seemed pretty alert and able to answer his questions without much difficulty.   He had a recent paracentesis at [facility] about a week ago where 6 liters of fluid was drained. He was also given some albumin. He has had several paracenteses over time, but usually does not require unless his ascites is significant. He is normally followed at [facility] where he has had evaluation for years. The patient denied having any fevers, chills or abdominal pain. There are no signs of gastrointestinal bleeding. When he was put brought in the daughter provided all the history. At this time the daughter is not present; however, the patient is alert enough to answer all the questions I asked.   PAST MEDICAL HISTORY: Notable for alcoholic cirrhosis of the liver. He also has insulin-dependent diabetes. The patient has had esophageal banding at [facility] in the past. Most recent endoscopy revealed a resolution of varices. He has had spinal stenosis repair, hernia repair and known history of pancytopenia due to liver cirrhosis.   The patient has no known drug allergies.   HOME MEDICATIONS: He takes Xifaxan 550 two times a day, Aldactone 200 mg daily, Paxil 10 mg daily, omeprazole 40 mg daily, insulin, Lasix and iron - Lasix he takes 80 mg daily; it is a little unclear how much lactulose he takes, but according to his nurse he supposedly takes 4 tbsp 3 to 4 times a day. According to the daughter he has  been pretty compliant with medications, which he was not in the past when he initially was admitted.   SOCIAL HISTORY: He lives at home with daughter. There is no alcohol, drugs or tobacco use.   FAMILY HISTORY: Notable for alcoholism.   REVIEW OF SYSTEMS: No fevers or chills. There is no nausea or vomiting. There is no abdominal pain. There is no gross hematochezia or melena. There is no chest pain or palpitation. No cough, or shortness of breath.   PHYSICAL EXAMINATION: GENERAL: The patient was in no acute distress.  VITAL SIGNS: His vital signs are stable. He is afebrile.  HEENT: Normocephalic, atraumatic head. Pupils are equally reactive. Throat was clear.  NECK: Supple.  CARDIAC: Regular rhythm and rate.  LUNGS: Clear bilaterally.  ABDOMEN: Normoactive bowel sounds, soft. There is no hepatomegaly. He had active bowel sounds. There is evidence of ascites with some fluid shift.  EXTREMITIES: No significant edema.  NEUROLOGICAL: The patient appears fairly alert at this time. There is no evidence of asterixis.   LABORATORY, DIAGNOSTIC, AND RADIOLOGICAL DATA: From June 28: Sodium 136, potassium 4.1, chloride 103, CO2 24, BUN 33, creatinine 1.56. Ammonia level on admission was 184. Liver enzymes showed a bilirubin 1.3. The rest of the liver enzymes are normal. White count was 4.0, hemoglobin 8.9 on admission. Platelet count was only 49. UA was negative.   IMPRESSION: This is a patient with known history of liver cirrhosis who presents with recurrent hepatic encephalopathy. It is unclear what precipitated the most recent encephalopathic  episode. According to the patient he is compliant with the medication. He appears to be much more alert than when he presented. We will need to recheck his ammonia level tomorrow morning. We need to continue with the Xifaxan twice a day and lactulose every six hours on a regular basis. We need to find the right dose for him to go home. If the ammonia level is back  to normal and he continues to do well, he should be able to be discharged to home by tomorrow. Thank you for the referral.   ____________________________ Lupita Dawn. Candace Cruise, MD pyo:aw D: 12/11/2012 07:53:15 ET T: 12/11/2012 11:18:28 ET JOB#: [id number]  cc: Lupita Dawn. Candace Cruise, MD, <Dictator> Lupita Dawn Jaun Galluzzo MD ELECTRONICALLY SIGNED 12/12/2012 13:55

## 2014-10-05 NOTE — H&P (Signed)
PATIENT NAMECHORD, [name] MR#:  [id number] DATE OF BIRTH:  17-Mar-1941  DATE OF ADMISSION:  12/28/2012  CHIEF COMPLAINT: Blood in stool.   PRIMARY CARE PHYSICIAN: Juluis Pitch  REFERRING PHYSICIAN:  Ferman Hamming.   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 He also has a history of GI bleeding due to esophageal varices in the past five years, and he has been followed up by [facility] hepatology, Dr. Archer Asa and he had a history of going Monday, two days ago, for an EGD; found out to have grade 2 esophageal varices. No procedure done apparently.  The patient started having some significant weakness last night and he could not sleep due to the sore throat from the procedure. This morning, he had two bowel movements; they both had a good amount of red blood, about a tablespoon per bowel movement and the patient started to get weaker and weaker through the day. He is having borderline low blood pressure, which could be chronic, although the patient is symptomatic.  The patient is admitted for evaluation of this problem.   REVIEW OF SYSTEMS:  A 12-system review of systems is done.   CONSTITUTIONAL: Denies any fever, fatigue. Weakness is positive. No weight gain. No weight loss.   EYES: No blurry vision, double vision or inflammation.   ENT: No difficulty swallowing. No postnasal drip. No sinus pain.   RESPIRATORY: No significant cough, sputum, wheezing or painful respirations.   CARDIOVASCULAR: No chest pain, orthopnea, syncope or palpitations. She denies nausea, vomiting, abdominal pain.  No hematemesis. Negative melena. Positive hematochezia today x 2. The patient has ascites and he had a paracentesis within the last 2 months. No jaundice.   GENITOURINARY: No  dysuria, hematuria, changes in frequency.  No prostatitis.   ENDOCRINE: No polyuria, polydipsia, polyphagia, cold or heat intolerance. The patient has diabetes and apparently has been very well controlled on Lantus.   HEMATOLOGIC/LYMPHATIC: No anemia, easy bruising or bleeding. The patient has liver disease with mild coagulopathy and his INR is only 1.4 today and PT of 16.   SKIN: No rashes, petechiae (or new  lesions.   MUSCULOSKELETAL:  No significant back pain, leg pain or joint pain.   NEUROLOGICAL: No significant changes in his mental status. The patient has history of encephalopathy, for which he has been admitted on multiple occasions, but right now, he feels very clear minded.   NEUROLOGIC: No headaches. No CVAs or transient ischemic attacks.   PSYCHIATRIC: No significant changes in judgment. The patient has no depression or anxiety.   PAST MEDICAL HISTORY: 1. Alcoholic cirrhosis.  2. End-stage liver disease on transplant list.  3. Diabetes: Insulin-dependent.  4. Esophageal varices.  5. History of pyloric stenosis.  6. Hernia repair.  7. 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  8. Chronic elevation of liver function tests.   ALLERGIES: Not known drug allergies.   PAST SURGICAL HISTORY: 1. Multiple EGD.  2. Pyloric stenosis repair as a baby at 3 months ago.  3. Hernia repair.   CURRENT MEDICATIONS: I checked with the patient: 1. Xifaxan 550 mg twice daily.  2. Vitamin D 400 mg twice daily.  3. Spironolactone 200 mg once a day.  4. Paxil 10 mg once a day.  5. Omeprazole 40 mg once a day.  6. Lantus  56 units once a day.  7. Lactulose 3 or 4 times a day.  8. Lasix 80 mg once daily.  9. Ferrous sulfate 325 mg twice daily.  10. Sodium benzoate once a day, this medication has been stopped due to secondary effects.   SOCIAL HISTORY: The patient lives with his daughter. 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 He has quit for a while, he cannot remember how long. He denies any  drug use. Has never smoked.   FAMILY HISTORY: Positive for alcohol abuse in both parents. No cancer. No diabetes. No MIs.   PHYSICAL EXAMINATION: Systolic blood pressure 536/14, pulse 79, respirations 18, temperature 99.3.   GENERAL: The patient is alert, oriented x 3. No acute distress. No respiratory distress. He is hemodynamically stable. His pupils are equal and reactive. Extraocular movements are intact. Mucosa are moist. Anicteric sclerae. Pink conjunctivae. No oral lesions. No oropharyngeal exudates. No thrush.   NECK: Supple. No JVD. No thyromegaly. No adenopathy. No carotid bruits. No rigidity. The patient is normocephalic, atraumatic.   CARDIOVASCULAR: Regular rate and rhythm. Positive systolic ejection murmur, 2/6. No displacement of PMI. No tenderness to palpation of anterior chest wall. No gallop or rubs.   LUNGS: Clear without any wheezing or crepitus. No use of accessory muscles.   ABDOMEN: Soft, distended due to ascites. No tenderness to palpation of abdominal wall. No tenderness to deep palpation. No rebound. No guarding.   GENITAL: Deferred.   EXTREMITIES: No significant edema, cyanosis or clubbing. Pulses +2. Capillary refill less than 3.   LYMPHATIC: Negative for lymphadenopathy in neck or supraclavicular areas.   SKIN: Without any rashes, petechiae. No jaundice. No new lesions.   MUSCULOSKELETAL: No significant joint deformity or joint effusions.   NEUROLOGIC: Cranial nerves II through XII intact. His strength is 5/5 in all four extremities. No focal findings.   PSYCHIATRIC: Negative for agitation. Good judgment. Alert, oriented x 3.   LABORATORY, DIAGNOSTIC AND RADIOLOGICAL DATA: Glucose 153, BUN 42, creatinine 1.92. His baseline is around 1.5. Sodium is 134, potassium is 4.6, anion gap is 3, calcium 8.3, albumin is 2.7, bilirubin 1.3, AST is 42. White count is 5.9 previous white count was 2.7, hemoglobin is 7.4 dropped from 8.0 on 06/29 and prior to that on June  27 he was 8.9. Platelet count is 63,000. In the past it has been as low as 37,000, so it has improved. The patient has elevation of MCV, MCH with microcytosis. INR is 1.4. Urinalysis shows 1 white blood cell, no leukocyte esterase, no nitrites.   EKG: Normal sinus rhythm.   ASSESSMENT AND PLAN: A 74 year old gentleman with history of alcoholic cirrhosis, end-stage liver disease and transaminase diabetes, pancytopenia and chronic elevation of her liver function tests comes with gastrointestinal bleeding.  1. Gastrointestinal bleeding. The patient has hematochezia with bright red blood cells on the stool. He has history of esophageal varices.  He has been feeling weak. His blood pressure is borderline low, for what we are going to admit him for evaluation of his problems. He is status post an EGD on Monday.  I doubt if this is a complication of the EGD. I doubt if he is bleeding profusely from the varices, but since he has them and since he has history of bleeding before and bounding, we are going to admit him to rule out the possibility of this.  2. Hemoglobin is going to be checked every 6 hours and monitor closely. The patient is going to have a gastrointestinal  evaluation and telemetry monitoring to prevent or to observe for any fast bleeding, monitor pulse and blood pressure.  3. Since he has a history of gastrointestinal bleeding from esophageal varices, I am going to put him on a Protonix drip and octreotide drip.  4. Boluses for both medications have been given and administered on the emergency room.  5. Awaiting for gastrointestinal consultation.  6. Alcoholic cirrhosis. The patient has being monitored by [facility]. At this moment, there are no major changes on his liver function tests.  7. Diabetes. The patient is going to be n.p.o. for what we are going to just use insulin sliding scale for now. He has Lantus 54 units scheduled subcutaneously. We are going to see how his blood pressure is in the next  5 to 6 hours and if it continues to be elevated above 200, we are going to at maybe a third of the dose of Lantus. For now, we are going to observe.  8. 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 He also has macrocytosis, likely due to liver disease. I am going to check vitamin B12 levels.  9. Sodium is low, likely dehydrated and also has acute kidney injury for what we are going to give him IV fluids.    -Repeat HB show a significant drop on HP to 6.8 . Pt BP is lower than previos at admission.  with his history of varices adn previous bleeding pt is at risk of rapid deterioration. concern about fast bleeding. d/w Rn his meds octreotide and protonix drio has not been started yet, need to sart this med stat.  pt ad risk of cardiac arrest due to rapid bleeding.  Critical care time 50 min  CODE STATUS:  The patient is a full code.   TIME SPENT: I spent about 50 minutes with this patient.    ____________________________ [place] Sink, MD rsg:rw D: 12/28/2012 15:30:51 ET T: 12/28/2012 16:21:19 ET JOB#: [id number]  cc: [place] Sink, MD, <Dictator> Benna Arno America Brown MD ELECTRONICALLY SIGNED 12/29/2012 20:28

## 2014-10-05 NOTE — Consult Note (Signed)
Chief Complaint:  Subjective/Chief Complaint Some rectal bleeding with dark stool. Drop in hgb. Abd getting large. U/S show moderate ascites. BP still low.   VITAL SIGNS/ANCILLARY NOTES: **Vital Signs.:   27-Jul-14 08:00  Vital Signs Type Routine  Temperature Temperature (F) 98.1  Celsius 36.7  Temperature Source oral  Pulse Pulse 68  Respirations Respirations 20  Systolic BP Systolic BP 659  Diastolic BP (mmHg) Diastolic BP (mmHg) 57  Mean BP 72  Pulse Ox % Pulse Ox % 96  Oxygen Delivery Room Air/ 21 %  Pulse Ox Heart Rate 60   Brief Assessment:  GEN no acute distress   Cardiac Regular   Respiratory clear BS   Gastrointestinal distended with ascites   Lab Results: Routine Chem:  27-Jul-14 05:31   Cholesterol, Serum 73  Triglycerides, Serum 55  HDL (INHOUSE)  36  VLDL Cholesterol Calculated 11  LDL Cholesterol Calculated 26 (Result(s) reported on 08 Jan 2013 at 05:54AM.)  Result Comment LABS - This specimen was collected through an   - indwelling catheter or arterial line.  - A minimum of 23mls of blood was wasted prior    - to collecting the sample.  Interpret  - results with caution.  Result(s) reported on 08 Jan 2013 at 05:45AM.  Glucose, Serum  136  BUN  25  Creatinine (comp)  1.72  Sodium, Serum 136  Potassium, Serum 4.0  Chloride, Serum  108  CO2, Serum 24  Calcium (Total), Serum  7.8  Anion Gap  4  Osmolality (calc) 278  eGFR (African American)  45  eGFR (Non-African American)  39 (eGFR values <64mL/min/1.73 m2 may be an indication of chronic kidney disease (CKD). Calculated eGFR is useful in patients with stable renal function. The eGFR calculation will not be reliable in acutely ill patients when serum creatinine is changing rapidly. It is not useful in  patients on dialysis. The eGFR calculation may not be applicable to patients at the low and high extremes of body sizes, pregnant women, and vegetarians.)  Routine Hem:  27-Jul-14 05:31   WBC  (CBC)  3.1  RBC (CBC)  2.38  Hemoglobin (CBC)  7.8  Hematocrit (CBC)  23.6  Platelet Count (CBC)  34  MCV 99  MCH 32.7  MCHC 32.9  RDW  21.2  Neutrophil % 68.5  Lymphocyte % 10.5  Monocyte % 12.8  Eosinophil % 7.2  Basophil % 1.0  Neutrophil # 2.2  Lymphocyte #  0.3  Monocyte # 0.4  Eosinophil # 0.2  Basophil # 0.0   Radiology Results: [place]:    26-Jul-14 18:21, US Abdomen Limited Survey  US Abdomen Limited Survey   PRELIMINARY REPORT    The following is a PRELIMINARY Radiology report.  A final report will follow pending radiologist verification.      REASON FOR EXAM:    ascites  COMMENTS:   Body Site: Ascites search - Abd. Quadrants imaged for free   fluid    PROCEDURE: [place]  - US ABDOMEN LIMITED SURVEY  - Jan 07 2013  6:21PM     RESULT:     Findings: A small to moderate amount of ascites is identified within the   abdomen.    IMPRESSION:  Ascites within the abdomen as described above.    Thank you for the opportunity to contribute to the care of your patient.     Dictated By: Mikki Santee, M.D., MD   Assessment/Plan:  Assessment/Plan:  Assessment Variceal bleeding. Still on dopamine of  BP support.   Plan At this point, repeating EGD with banding will unlikely help if patient has recurrent bleeding. Sclerotherapy may be considered or transfer to his [facility] doctor may be necessary. Otherwise, schedule paracentesis tomorrow so patient can breathe better. If radiology cannot schedule, then start diuretics as long as BP tolerates it. I will be out this week. Dr. Rayann Heman will cover rest of this week.   Electronic Signatures: Verdie Shire (MD)  (Signed 27-Jul-14 10:38)  Authored: Chief Complaint, VITAL SIGNS/ANCILLARY NOTES, Brief Assessment, Lab Results, Radiology Results, Assessment/Plan   Last Updated: 27-Jul-14 10:38 by Verdie Shire (MD)

## 2014-10-05 NOTE — Consult Note (Signed)
PATIENT NAME:  [name], [name] MR#:  [id number] DATE OF BIRTH:  1940-07-09  DATE OF CONSULTATION:  12/29/2012  REFERRING PHYSICIAN:  Gladstone Lighter, MD CONSULTING PHYSICIAN:  Verdie Shire, MD / Payton Emerald, NP  PRIMARY CARE PHYSICIAN:  Juluis Pitch, MD  TRANSPLANT SERVICE:  [facility], Dr. Tamala Julian  REASON FOR CONSULTATION: Esophageal varices, hematochezia.   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 Has undergone surgical repair and hernioplasty. He is under the care of Dr. Tamala Julian at [facility] transplant service. He had an upper endoscopy done by Dr. Tillie Rung on 12/26/2012 for the indication of established esophageal varices followup. He had an EGD on 11/22/2012 which did require banding of grade 3 esophageal varices at that time. The procedure that was done this week revealed 2 to 3 columns of grade 2 esophageal varices found in the middle to lower third of the esophagus. Four bands were successfully placed with appropriate deflation of varices.  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 Duodenal bulb was normal. Patchy mildly erythematous mucosa was found in the second part of the duodenum. On 12/23/2012, he had an echocardiogram done which revealed evidence of normal left ventricular systolic function with mild LVH, normal right ventricular systolic function, valvular regurgitation, mild MR, trivial PR, trivial TR and valvular stenosis.  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   The patient had done well after having EGD, on 12/26/2012.  Yesterday morning he had awoken with need to defecate, passed evidence of feces with bright red blood. He had 2 occurrences  similar to this presentation with moderate amount of blood being noted. He presented to the Emergency Room at approximately 11:30 after the onset at 10:30. No associated nausea. No vomiting. No hematemesis. No change in bowel habit pattern. Color of stool is black, but he does take iron on a daily basis thus camouflaging and causing difficulty to assess truly for evidence of melena. He has been very tired. He had 2 additional bowel movements yesterday evening, last one being at 11:00 with no evidence of bright red blood. He has had no similar occurrences to this in the past. Most recent colonoscopy was done 3 years ago.   Hemoglobin on admission was 7.4 with hematocrit of 21.7. He did receive 2 units of packed red blood cells. Hemoglobin did decline to 5.8 at 1:00 a.m. this morning.  Today, this morning, elevated up to 7.8 with hematocrit of 23.2 and platelet count is 44,000 at this time.    HOME MEDICATIONS: 1.  Xifaxan 550 mg twice a day. 2.  Vitamin D 400 mg twice a day. 3.  Spironolactone 200 mg once a day. 4.  Paxil 10 mg once a day. 5.  Omeprazole 40 mg once a day. 6.  Lantus 56 units once a day. 7.  Lactulose 30 mL 3 to 4 times a day as directed.  8.  Lasix 80 mg a day.  9.  Ferrous sulfate 325 mg twice a day. 10.  Sodium benzoate once a day. Medication has been temporarily held due to secondary effects.  ALLERGIES: No known drug allergies.   PAST MEDICAL HISTORY: Alcoholic cirrhosis, end-stage liver disease on transplant list at [facility], diabetes mellitus insulin-dependent, esophageal varices, pyloric stenosis,  hernia repair, pancytopenia secondary to hypersplenia, chronic elevation of LFTs.   PAST SURGICAL HISTORY: Multiple EGDs, colonoscopy 3 years ago, pyloric stenosis repair as a baby at 64 months of age and hernia repair.   FAMILY HISTORY: Noncontributory at this time, but positive for alcohol abuse involving both parents. No neoplasm.   SOCIAL HISTORY:  Resides with his daughter. History of heavy alcohol use, has abstained for a while at this time. No drug use. No tobacco use.   REVIEW OF SYSTEMS: CONSTITUTIONAL: Significant for fatigue. Weight has essentially remained stable, although  he does have a known history of ascites and has required paracentesis in the past.  EYES: No blurred vision, double vision.  ENT: No difficulty swallowing.  RESPIRATORY: No coughing. No wheezing. Shortness of breath in the past associated with ascites, but at this time denying any shortness of breath, has been doing well.  CARDIOVASCULAR: No chest pain.  Heart palpitations.  Recently abnormal echocardiogram, see history.  GASTROENTEROLOGY: See HPI.  GENITOURINARY: No dysuria or hematuria.  ENDOCRINE: No polyuria or polydipsia. HEMATOLOGIC: Significant for easy bruising and bleeding. History of chronic anemia in association with known history of end-stage liver disease.  SKIN: No rashes. No lesions.  MUSCULOSKELETAL: No neuralgias, myalgias.  NEUROLOGIC: History of hepatic encephalopathy, but at this time doing well. No history of CVA or TIA.  PSYCH: No depression. No anxiety.   PHYSICAL EXAMINATION: VITAL SIGNS: Temperature is 97.9 with a pulse of 63, respirations 18, and blood pressure 98/60 with a pulse ox of 91% on room air.  GENERAL: Well developed, overweight 75 year old Caucasian gentleman. No acute distress noted. Appears to be resting comfortably in bed. Daughter and son-in-law present.  HEENT: Normocephalic, atraumatic. Pupils equal and reactive to light. Conjunctivae clear, pale. NECK: Supple. Trachea midline. No lymphadenopathy or thyromegaly.  LUNGS:  Symmetric rise and fall of chest. Clear to auscultation throughout.  HEART:  Regular rhythm, S1 and S2, 1/6 systolic murmur.  ABDOMEN: Distended but no gross evidence of ascites. Bowel sounds in 4 quadrants. No masses. No bruits. Due to abdominal girth size, difficult to assess for evidence of  hepatosplenomegaly.  RECTAL: Deferred.  MUSCULOSKELETAL: Moving all 4 extremities. No contractures. No clubbing.  EXTREMITIES: No pitting edema.  SKIN: No lesions. No rashes. Color pale.  NEUROLOGICAL: No gross neurological deficits. PSYCH: Alert and oriented x 4. Memory grossly intact. Appropriate affect and mood.   LABORATORY DATA: Chemistry panel on admission:  Glucose was 153, BUN was 42, creatinine was 1.92 and sodium of 134. EGFR was 34. Anion gap was 3. Calcium was 8.3. Comparison today's date, glucose is 109, BUN 40, creatinine is 1.77, EGFR is 38 and calcium is 8.2. Hepatic panel: Albumin was 2.7, total bilirubin is 1.3 and AST is 42, otherwise within normal limits. Total protein has dropped from 6.6 at 6.0. Albumin dropped to 2.4 with a total bili of 2.1, which has risen, AST and ALT have normalized. Hemoglobin and hematocrit results as noted under history. Antibody screen is positive. ABO group is B positive.  Two units of packed red blood cells have been transfused. PT is 16.0 with an of INR 1.4. Urinalysis is essentially within normal limits.   MELD score according to daughter when seen this week at [facility] was 13.   IMPRESSION:  1.  Known history of alcoholic cirrhosis and end-stage liver disease secondary to alcoholic cirrhosis. Currently on transplant list at [facility].  2.  History of esophageal varices status post banding, 12/26/2012. Presented  with evidence of gastrointestinal bleed.  3.  Chronic anemia secondary to known history of chronic liver disease.  4.  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   PLAN: The patient's presentation was discussed with Dr. Verdie Shire. We will proceed forward with EGD this afternoon to allow direct luminal evaluation of upper GI tract, concern of possibly banding has flipped, evidence of bleeding occurring from this source. The procedure risks versus benefits discussed with the patient, as well as a family member.  N.p.o. status.  Will continue to closely monitor hemoglobin and hematocrit. Depending on findings, may need to consider proceeding with colonoscopy as the patient is at risk for rectal varices as well.   These services provided by Payton Emerald, MS, APRN, [facility], FNP under collaborative agreement with Verdie Shire, MD. ____________________________ Payton Emerald, NP dsh:sb D: 12/29/2012 12:33:44 ET T: 12/29/2012 13:08:36 ET JOB#: [id number]  cc: Payton Emerald, NP, <Dictator> Payton Emerald MD ELECTRONICALLY SIGNED 12/30/2012 15:20

## 2014-10-05 NOTE — Consult Note (Signed)
Chief Complaint:  Subjective/Chief Complaint Pt alert. 6-8 BM's yest. Tells me he takes "shot glass" of lactulose tid   VITAL SIGNS/ANCILLARY NOTES: **Vital Signs.:   29-Jun-14 06:08  Vital Signs Type Routine  Temperature Temperature (F) 98  Celsius 36.6  Temperature Source oral  Pulse Pulse 65  Respirations Respirations 20  Systolic BP Systolic BP 97  Diastolic BP (mmHg) Diastolic BP (mmHg) 60  Mean BP 72  Pulse Ox % Pulse Ox % 99  Pulse Ox Activity Level  At rest  Oxygen Delivery Room Air/ 21 %   Brief Assessment:  GEN no acute distress   Cardiac Regular   Respiratory clear BS   Gastrointestinal Normal   Lab Results: Routine Chem:  29-Jun-14 06:22   Glucose, Serum  124  BUN  31  Creatinine (comp)  1.51  Sodium, Serum 137  Potassium, Serum 4.3  Chloride, Serum 106  CO2, Serum 27  Calcium (Total), Serum  8.3  Anion Gap  4  Osmolality (calc) 282  eGFR (African American)  53  eGFR (Non-African American)  46 (eGFR values <26m/min/1.73 m2 may be an indication of chronic kidney disease (CKD). Calculated eGFR is useful in patients with stable renal function. The eGFR calculation will not be reliable in acutely ill patients when serum creatinine is changing rapidly. It is not useful in  patients on dialysis. The eGFR calculation may not be applicable to patients at the low and high extremes of body sizes, pregnant women, and vegetarians.)  Ammonia, Plasma < 25 (Result(s) reported on 11 Dec 2012 at 07:04AM.)  Routine Hem:  29-Jun-14 06:22   WBC (CBC)  2.7  RBC (CBC)  2.28  Hemoglobin (CBC)  8.0  Hematocrit (CBC)  23.3  Platelet Count (CBC)  40  MCV  102  MCH  35.3  MCHC 34.5  RDW  19.7  Neutrophil % 58.2  Lymphocyte % 13.9  Monocyte % 16.6  Eosinophil % 9.4  Basophil % 1.9  Neutrophil # 1.6  Lymphocyte #  0.4  Monocyte # 0.5  Eosinophil # 0.3  Basophil # 0.1 (Result(s) reported on 11 Dec 2012 at 06:38AM.)   Assessment/Plan:  Assessment/Plan:   Assessment Hepatic encephalopathy. Stable.   Plan Ok for discharge today. Discharge on xifaxan 550 bid and lactulose 30cc qid for now. [facility] Will sign off. Thanks.   Electronic Signatures: OVerdie Shire(MD)  (Signed 29-Jun-14 08:41)  Authored: Chief Complaint, VITAL SIGNS/ANCILLARY NOTES, Brief Assessment, Lab Results, Assessment/Plan   Last Updated: 29-Jun-14 08:41 by OVerdie Shire(MD)

## 2014-10-05 NOTE — Consult Note (Signed)
Chief Complaint:  Subjective/Chief Complaint No further bleeding overnight. Hgb stable.   VITAL SIGNS/ANCILLARY NOTES: **Vital Signs.:   18-Jul-14 05:24  Vital Signs Type Routine  Temperature Temperature (F) 98.5  Celsius 36.9  Temperature Source oral  Pulse Pulse 62  Respirations Respirations 18  Systolic BP Systolic BP 98  Diastolic BP (mmHg) Diastolic BP (mmHg) 60  Mean BP 72  Systolic BP Systolic BP 98  Diastolic BP (mmHg) Diastolic BP (mmHg) 60  Pulse Lying Pulse Lying 72  Systolic BP Systolic BP 98  Diastolic BP (mmHg) Diastolic BP (mmHg) 61  Pulse Pulse Sitting 67  Systolic BP Systolic BP 95  Diastolic BP (mmHg) Diastolic BP (mmHg) 56  Pulse Standing Pulse Standing 76  Pulse Ox % Pulse Ox % 96  Pulse Ox Activity Level  At rest  Oxygen Delivery Room Air/ 21 %   Brief Assessment:  GEN no acute distress   Cardiac Regular   Respiratory normal resp effort   Gastrointestinal Normal   Lab Results: Routine Chem:  18-Jul-14 05:30   Glucose, Serum 98  BUN  35  Creatinine (comp)  1.57  Sodium, Serum 139  Potassium, Serum 4.7  Chloride, Serum  109  CO2, Serum 23  Calcium (Total), Serum  8.0  Anion Gap 7  Osmolality (calc) 285  eGFR (African American)  51  eGFR (Non-African American)  44 (eGFR values <62mL/min/1.73 m2 may be an indication of chronic kidney disease (CKD). Calculated eGFR is useful in patients with stable renal function. The eGFR calculation will not be reliable in acutely ill patients when serum creatinine is changing rapidly. It is not useful in  patients on dialysis. The eGFR calculation may not be applicable to patients at the low and high extremes of body sizes, pregnant women, and vegetarians.)  Routine Hem:  18-Jul-14 05:30   WBC (CBC)  2.7  RBC (CBC)  2.37  Hemoglobin (CBC)  8.1  Hematocrit (CBC)  23.6  Platelet Count (CBC)  44  MCV 100  MCH 33.9  MCHC 34.1  RDW  22.9  Neutrophil % 66.7  Lymphocyte % 10.8  Monocyte % 14.0   Eosinophil % 7.6  Basophil % 0.9  Neutrophil # 1.8  Lymphocyte #  0.3  Monocyte # 0.4  Eosinophil # 0.2  Basophil # 0.0 (Result(s) reported on 30 Dec 2012 at 06:26AM.)   Assessment/Plan:  Assessment/Plan:  Assessment Variceal bleeding. S/P banding.   Plan Start clears. If tolerated, can advance to full liquids later. Make sure to keep patient on liquid diet only today since bands were placed. Can try soft foods tomorrow if no signs of bleeding rest of today. Continue octreotide drip rest of today. Can switch to oral protonix. Prefer to switch to either nadolol or inderal once octreotide weaned off. However, pt's SBP has always been on the low side. Thus, may not be possible. Dr. Gustavo Lah to see patient over the weekend. Thanks.   Electronic Signatures: Verdie Shire (MD)  (Signed 18-Jul-14 07:42)  Authored: Chief Complaint, VITAL SIGNS/ANCILLARY NOTES, Brief Assessment, Lab Results, Assessment/Plan   Last Updated: 18-Jul-14 07:42 by Verdie Shire (MD)

## 2014-10-05 NOTE — H&P (Signed)
PATIENT NAMEQUINTAN, [name] MR#:  [id number] DATE OF BIRTH:  1940/07/13  DATE OF ADMISSION:  01/05/2013  REASON FOR ADMISSION: Gastrointestinal bleeding.   PRIMARY CARE PHYSICIAN: Dr. Juluis Pitch.   REFERRING PHYSICIAN:  Marjean Donna.  HISTORY OF PRESENT ILLNESS: This is a very nice 74 year old gentleman who I met on multiple hospitalizations before. He comes today with a history of cirrhosis of the liver, multiple episodes of hepatic encephalopathy. He also has diabetes, esophageal varices, status post spinal stenosis repair and hernia repair.   Comes with GI bleeding that started at 5:00 p.m. this afternoon.  Apparently, he had a very large episode with lots of blood. He cannot calculate how much, but he states that it could be close to 1/2 pint. There was a lot of blood in the toilet. He has had no pain. He had no further bowel movements. He has been feeling sleepy. The color of the bleeding was just bright red. No melena. The patient has ascites, which has been increasing in size.  He has gained at least  9 pounds, and he had a paracentesis of 6 liters on 06/23.   The patient was evaluated in the Emergency Department, where his blood pressures were low, in the 90s/40s, and during the time he was there, decreased his blood pressure to 76/40, 70s/40s several times. At this moment, his blood pressure is around 80s/50s.   The patient is admitted for evaluation. Apparently, he had a history of previous admission of confusion, which was due to hepatic encephalopathy, which was actually secondary to variceal bleeding.   His varices were banded at [facility] 4 times, and whenever he came he got rescoped because of the bleeding, and all the bands were off and he needed to be rebounded.   Today, he comes and he has been weak, dizzy, lightheaded, and he is admitted for evaluation. His hemoglobin actually is a little bit better than it was before. It is at  9.6. Prior to that, it was 8, but the patient  is fairly symptomatic with low blood pressures.   The patient admitted to the CCU.   REVIEW OF SYSTEMS:  A 12-system review of systems:  CONSTITUTIONAL: No fever. Positive fatigue. Positive weakness. Positive weight gain of 9 pounds.  EYES: No double vision or blurry vision.  EARS, NOSE, THROAT: No tinnitus. No difficulty swallowing.  RESPIRATORY: No cough, wheezing or hemoptysis. The patient feels short of breath because of the increase in the ascites, pressure into his lungs.  CARDIOVASCULAR: No chest pain. Positive orthopnea. No syncope, no palpitations.  GASTROINTESTINAL: No abdominal pain. Positive distention due to ascites. Positive hematochezia, bright red blood per rectum. No melena. No nausea or vomiting.  GENITOURINARY: No hematuria or changes in urinary frequency. No prostatitis.  ENDOCRINOLOGY: No polyuria, polydipsia, polyphagia, cold or heat intolerance. HEMATOLOGIC AND LYMPHATIC: Positive anemia. Positive easy bruising. Positive coagulopathy. No swollen glands.  SKIN: No significant rashes, petechiae. Positive pallor. MUSCULOSKELETAL: No significant neck pain, positive chronic back pain, which is mild. No gout.  NEUROLOGIC: No numbness, tingling. No CVAs or ataxia.  PSYCHIATRIC: No significant anxiety or depression.   PAST MEDICAL HISTORY:   1.  Alcoholic cirrhosis of the liver.  2.  Diabetes, insulin-dependent.  3.  Esophageal variceal bleeding in the past.  4.  Esophageal varices.  5.  Status post spinal stenosis repair.  6.  Hernia repair.  7.  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  8.  Portal hypertension.  9.  Chronic elevation of LFTs.  10.  Left and right atrium enlargement.   PAST SURGICAL HISTORY: Multiple EGDs with esophageal variceal banding.  Left inguinal repair. 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 Status post spinal stenosis repair.   ALLERGIES: No known drug allergies.   MEDICATIONS: Currently taking cephalexin 550 mg twice daily, vitamin E 400 mg twice daily,  spironolactone 200 mg once a day, Paxil 10 mg daily, omeprazole 40 mg daily, Lantus 12 units once a day, lactulose 4 tablespoons 3 to 4 times a day, Lasix 180 mg once daily, ferrous sulfate 325 mg twice daily.   SOCIAL HISTORY: The patient currently lives with his daughter and her husband. He used to be a very heavy drinker, has been drinking for many years. He does not smoke. He is not working. He is on disability.   FAMILY HISTORY: Positive for alcohol abuse in both parents. No coronary artery disease. No cancer.   PHYSICAL EXAMINATION:  VITAL SIGNS:  As mentioned above, blood pressures of 70s/40s, heart rate 59, respirations 18, temperature 98.4, oxygen saturation 98% on room air.  GENERAL: The patient is alert and oriented x 3. Well nourished. No significant distress.  ENT: Normocephalic, atraumatic. Pupils are equal and reactive. Extraocular movements are intact. Mucosa is moist. Anicteric sclerae. Pink conjunctivae. No oral lesions. No oropharyngeal exudates.  NECK: Supple. No JVD. No thyromegaly. No adenopathy. No carotid bruits. No rigidity.  CARDIOVASCULAR: Regular rate and rhythm. No murmurs, rubs or gallops. No tenderness to palpation anterior chest wall. No displacement of PMI.  LUNGS: Clear without any wheezing or crepitus. Decreased respiratory sounds in both bases. No use of accessory muscles.  ABDOMEN: Soft, but significantly distended due to ascites. Ascitic fluid wave is palpated. No rebound tenderness.  + hepatic stigmata with vascular spiders, telangiectasias.   EXTREMITIES:  Pulses +2. Capillary refill less 3 on vascular, and he has no cyanosis, no clubbing. Edema is trace.  SKIN: No rashes or petechiae.  MUSCULOSKELETAL: No significant swelling of joints or effusions. Good range of motion all extremities.  NEUROLOGIC: Cranial nerves II through XII intact. Alert, oriented x 3. Strength is 5/5 in all 4 extremities.  PSYCHIATRIC: Alert, oriented. No agitation.  LYMPHATIC:  Negative for lymphadenopathy in neck or supraclavicular areas.   LABORATORY, DIAGNOSTIC AND RADIOLOGICAL DATA: Glucose is 228.  BUN 26, creatinine 1.87. His baseline is around 1.5, 1.6. Sodium 136, potassium 4.1. Other electrolytes within normal limits. Total protein 7.1, bilirubin 1.3, AST 41. White count is 3.7, hemoglobin 9.6, platelet count 50. INR is 1.5, prothrombin 17.8.  EKG: Normal sinus rhythm. No ST depression or elevation.   CHEST X-RAY: No signs of infiltrates. Central line placement with tip on the SVC.   ASSESSMENT AND PLAN: This is a nice 67 year old gentleman who I have met on previous hospitalizations comes with gastrointestinal bleeding and low blood pressures.  1.  Shock. The patient has hypovolemic shock with blood pressures in the 70s, critically ill. He looks better than what his labs look like, but is still critically ill. We are going to send him to the ICU and try to correct his main problem, which is the variceal bleeding.  2.  Variceal bleeding, gastrointestinal bleeding. The patient has history of this from before. The presentation is the same as previous hospitalizations. He has had banding on multiple occasions, and the last hospitalization here his bands came off and needed to be replaced. At this moment, I have discussed the case with GI. They are going to come and do emergently EGD and try  to evaluate.   At this moment, since the patient is in significant shock, we are going to continue IV fluids. We are going to do resuscitation, start him on dopamine MAPs below 65, and he is on Protonix drip and octreotide drip.   The patient is critically ill, going to the CCU.    3.  As far as his other medical problems, he has history of encephalopathy. It is controlled. He is very alert, oriented x 3.  4.  As far as his history of diabetes, he is nothing by mouth.  We are going to hold off on his insulin, and only do insulin sliding scale.  5.  History of acute on chronic  anemia. His hemoglobin is better. He might be a little bit hemoconcentrated. We are going to do a stat hemoglobin and transfuse as necessary.  6.  Liver cirrhosis. The patient is on the transplant list, but he is MELD score is low at 13 or 14.  7.  Ascites. The patient had drained 6.5 liters 2 weeks ago. At this moment, we are going to wait until he is more stable to do that.  8.  Chronic kidney disease, stage III, seems to be worsened today. He is in acute on chronic, and this is likely due to ATN from his bleeding. We are going to avoid nephrotoxins and we are going to hold the Lasix.  9.  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   CODE STATUS:  He is a full code, admitted to CCU.   CRITICAL CARE TIME:  Is about 60 minutes right now.  Please see procedure note for the central line, and notice that the critical care time is not included on the central line placement.    ____________________________ [place] Sink, MD rsg:dmm D: 01/05/2013 21:53:00 ET T: 01/05/2013 22:56:44 ET JOB#: [id number]  cc: [place] Sink, MD, <Dictator> Ryan Ogborn America Brown MD ELECTRONICALLY SIGNED 01/08/2013 0:39

## 2014-10-05 NOTE — H&P (Signed)
PATIENT NAME:  [name], [name] MR#:  [id number] DATE OF BIRTH:  04-18-1941  DATE OF ADMISSION:  11/26/2012  PRIMARY CARE PHYSICIAN: Gordon Roys. Lovie Macadamia, MD  CHIEF COMPLAINT: Confusion at home.   HISTORY OF PRESENT ILLNESS: History is obtained from patient, patient's daughter and old records [name] is a 74 year old Caucasian gentleman, well-known to our service from previous admissions. This is his third admission within the past month for a similar problem. The patient has a history of alcoholic cirrhosis of liver, comes in today with confusion, not able to recognize daughter who stays with him at home. Denies any vomiting, fever or any abdominal pain. The patient feels his ascites is progressively worsening. In the Emergency Room, the patient was found with ammonia of 79. Per daughter, daughter has been administrating his medications and he has been compliant with lactulose and Xifaxan. The patient recently got elective banding of his esophageal varices this past Tuesday with his [facility]. He received 1 unit of platelets prior to the procedure. The patient tolerated the procedure well, per daughter. Given his encephalopathy due to elevated ammonia, the patient is being admitted for further evaluation of his hepatic encephalopathy.   PAST MEDICAL HISTORY: 1.  Alcoholic cirrhosis of liver.  2.  Type 2 diabetes, now on sliding scale and diet controlled.  3.  Status post elective esophageal varices banding, which was done at [facility] this past Tuesday.  4.  Status post pyloric stenosis repair.  5.  Hernia repair.  6.  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  7.  Chronically elevated LFTs.   SOCIAL HISTORY: Lives at home with his daughter. No alcohol or any other drug use.   FAMILY HISTORY: Both parents were alcoholics as well.   ALLERGIES: No known drug allergies.   MEDICATIONS AT DISCHARGE: 1.  Metoclopramide 10 mg 1 tablet 4 times a day.  2.  Xifaxan 550 mg 1 tablet  b.i.d.  3.  NovoLog sliding scale.  4.  Ferrous sulfate 325 mg 1 b.i.d.  5.  Lasix 40 mg 3 tablets every other day.  6.  Lasix 60 mg every other day, alternating with 40 mg every other day.  7.  Paxil 10 mg daily.  8.  Spironolactone 100 mg every other day, alternate 150 mg every other day.  9.  Lactulose 30 mL 3 times a day to keep between 3 and 5 loose stools per day.   REVIEW OF SYSTEMS:    CONSTITUTIONAL: No fever. Positive for fatigue, weakness and weight gain.  EYES: No blurred or double vision.  EARS, NOSE, THROAT: No tinnitus, ear pain, hearing loss.  RESPIRATORY: No cough, wheeze, hemoptysis or dyspnea.  CARDIOVASCULAR: No chest pain, orthopnea, edema or arrhythmia.  GASTROINTESTINAL: No nausea, vomiting, diarrhea. The patient has chronically loose stools secondary to being on lactulose. Positive for GERD.  GENITOURINARY: No dysuria or hematuria.  ENDOCRINE: No polyuria, nocturia or thyroid problems.  HEMATOLOGIC: No anemia or easy bruising.  SKIN: No acne or rash.  MUSCULOSKELETAL: Positive for arthritis.  NEUROLOGIC: No CVA, TIA, dysarthria or epilepsy.  PSYCHIATRIC: No anxiety or depression.   All other systems reviewed and negative.   PHYSICAL EXAMINATION: GENERAL: The patient is awake, alert, oriented x 3, not in acute distress.  VITAL SIGNS: Temperature is 99.2. Pulse is 85. Blood pressure is 99/59. Sats are 96% on room air.  HEENT: Atraumatic, normocephalic. Pupils: PERRLA. EOM intact. Oral mucosa is dry.  NECK: Supple. No JVD. No carotid bruit.  LUNGS:  Clear to auscultation bilaterally. Decreased breath sounds at the bases. No respiratory distress or use of accessory muscles.  CARDIOVASCULAR: Both the heart sounds are normal. Rate, rhythm regular. PMI lateralized. Chest nontender. No murmur heard.  ABDOMEN: Distended from ascites, nontender. Could not palpate any organomegaly secondary to significant ascites. Could not appreciate any bowel sounds due to significant  ascites. EXTREMITIES: Trace 1+ pedal edema both lower extremities. Feeble pedal pulses, good femoral pulses.  NEUROLOGIC: Grossly intact cranial nerves II through XII. No motor or sensory deficits.  PSYCHIATRIC: The patient appears to be alert and oriented x 1. He is pleasantly confused.  Urinalysis negative for urinary tract infection. Hemoglobin and hematocrit 7.4 and 22.3. White count is 22.7. Platelet count is 44. Glucose is 131. BUN is 27. Creatinine is 1.52. Sodium is 134. Potassium is 4.2. Chloride is 103. Bicarbonate is 24. Bilirubin is 1.6. SGOT is 41. Troponin is less than 0.02. PT-INR 17.7 and 1.5.   ASSESSMENT AND PLAN: [name] is a 74 year old with history of alcoholic cirrhosis of liver, pancytopenia, ascites, is being readmitted for hepatic encephalopathy. This is the patient's admission for similar reason in the past 1 month. The patient is going to be admitted with:  1.  Hepatic encephalopathy with ammonia of 79 on admission: The patient is reportedly taking his Xifaxan and lactulose at home. He recently got esophageal varices banding this past Tuesday, which possibly could have precipitated some of the encephalopathy. He appears to be pleasantly confused. Will continue lactulose around the clock along with the Xifaxan. Will continue to monitor ammonia levels. Rule out any infection, but so far, urinalysis is negative and the patient does not have any respiratory symptoms.  2.  Worsening ascites: The patient's platelet levels are chronically low. He is on Aldactone and Lasix higher doses. If mentation improves, consider paracentesis prior to discharge.  3.  Alcoholic cirrhosis of liver, chronic, quit drinking many years ago. He is currently on the liver transplant list at [facility].  4.  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  5.  Type 2 diabetes: The patient's sugars have been stable. His hemoglobin is very low to even be registered, hence has been taken off his regular  Lantus insulin. Will put the patient back on his sliding scale insulin.   6.  Depression: Continue Paxil.  7.  Further work-up according to the patient's clinical course. Hospital admission plan was discussed with the patient and the patient's daughter.   TIME SPENT: 50 minutes.    ____________________________ Hart Rochester Posey Pronto, MD sap:jm D: 11/26/2012 15:50:01 ET T: 11/26/2012 17:08:14 ET JOB#: [id number]  cc: Katalea Ucci A. Posey Pronto, MD, <Dictator> Gordon Roys. Lovie Macadamia, MD Ilda Basset MD ELECTRONICALLY SIGNED 12/07/2012 14:19

## 2014-10-05 NOTE — Discharge Summary (Signed)
PATIENT NAMEKAYDAN, [name] MR#:  [id number] DATE OF BIRTH:  [birth year]  DATE OF ADMISSION:  11/10/2012 DATE OF DISCHARGE:  11/12/2012  PRESENTING COMPLAINT:  Confusion.   DISCHARGE DIAGNOSES: 1.  Hepatic encephalopathy improved.  2.  Cirrhosis of liver.  3.  Chronic pancytopenia.  CODE STATUS:  FULL CODE.   MEDICATIONS AT DISCHARGE: 1.  Metoclopramide 10 mg 1 tablet 4 times a day as needed.  2.  Effexor 550 mg 1 tablet b.i.d.  3.  Tradjenta 5 mg p.o. daily. 4.  NovoLog sliding scale as before.  5.  Ferrous sulfate 325 1 tablet b.i.d.  6.  Lactulose 30 mL b.i.d.  7.  Paxil 10 mg daily.  8.  Lasix 40 mg every other day with 60 mg every other day.  9.  Spironolactone 150 mg every other day and 100 mg every other day. 10.  Insulin Lantus home dose as before.   Follow up with Dr. Lovie Macadamia in 1 to 2 weeks.  Platelet count is 444. Glucose is 172, BUN 29, creatinine 1.48, sodium is 137, potassium 3.8. Chloride is 104. Magnesium is 2.5. UA negative for UTI. Ammonia level is 60 prior to discharge. Blood cultures negative in 48 hours. Ammonia level on admission was 80.  [name] is a pleasant 74 year old Caucasian gentleman with history of known cirrhosis of liver comes in with confusion for 2 days. He came in with ammonia level of 80. He was admitted with: 1.  Hepatic encephalopathy. He was continued on his lactulose around the clock and rifaximin had been resumed which he was apparently not taking. There was no source of infection identified. The patient was at his baseline. Ammonia level was down to 42 prior to discharge. 2.  Type 2 diabetes. The patient's dose of insulin was started back on Lantus 50 units and sliding scale.  3.  Cirrhosis, alcohol-induced. The patient follows up with [facility]. Tamala Julian at [facility]. His sodium was stable and he was continued on his Lasix and spironolactone.  3.  Pancytopenia likely due to cirrhosis of liver. 4.  CKD stage II. The patient was at baseline. His  Lasix and spironolactone were resumed.  Hospital stay otherwise remained stable.  THE PATIENT REMAINED A FULL CODE.   TIME SPENT:  40 minutes    ____________________________ Gus Height A. Posey Pronto, MD sap:ce D: 11/17/2012 12:44:43 ET T: 11/17/2012 13:06:43 ET JOB#: [id number]  cc: Deby Adger A. Posey Pronto, MD, <Dictator> Youlanda Roys. Lovie Macadamia, MD  Ilda Basset MD ELECTRONICALLY SIGNED 11/17/2012 19:42

## 2014-10-05 NOTE — Discharge Summary (Signed)
PATIENT NAMENATHANUEL, [name] MR#:  [id number] DATE OF BIRTH:  [birth year]  DATE OF ADMISSION:  11/26/2012 DATE OF DISCHARGE:  11/28/2012  ADMISSION DIAGNOSIS: Hepatic encephalopathy.   DISCHARGE DIAGNOSES: 1.  Hepatic encephalopathy likely triggered by recent esophageal banding.  2.  Renal insufficiency.  3.  Anemia, acute on chronic. 4.  History of liver cirrhosis.  5.  Hypotension.  6.  Pancytopenia.   CONSULTS:  Palliative care.   LABORATORY AND RADIOLOGICAL DATA:  At discharge: Sodium 139, potassium 3.7, chloride 108, bicarbonate 25, BUN 28, creatinine 1.55, glucose is 120, calcium 8.2, white blood cells 2.3, hemoglobin 7.8, hematocrit 23, platelets are 37.  HOSPITAL COURSE: This is a 74 year old male with liver cirrhosis who is on a transplant list who presented with hepatic encephalopathy. For further details, please refer to the H and P.   1.  Hepatic encephalopathy:  The patient's hepatic encephalopathy is improved. She will continue on Xifaxan and lactulose.  We feel that it might be precipitated due to the recent esophageal variceal procedure.  2.  Renal insufficiency:  His creatinine bumped up a little bit to 1.6, which was thought to be due to hypoperfusion from his acute on chronic anemia. He transfused 1 unit of PRBCs, and  his creatinine has improved and back to baseline.  3.  Anemia, acute on chronic, with baseline 7.4 to 8.4.  He is status post 1 unit of PRBCs. His discharge hemoglobin is 7.8.  4.  Liver cirrhosis:  The patient is on the transplant list at [facility].  He is status post esophageal banding last week at [facility].   5.  Hypotension:  Which is at his baseline.  6.  Pancytopenia:  From liver cirrhosis.  DISCHARGE MEDICATIONS: 1.  Xifaxan 550 mg b.i.d.  2.  NovoLog sliding-scale insulin.  3.  Ferrous sulfate 325 b.i.d.   4.  Spironolactone 100 mg every other day alternating with 150 mg daily.  5.  Omeprazole 40 mg daily.  6.  Paxil 20 mg daily.  7.  Lasix 40 mg  1-1/2 tablets alternating with 40 mg.  8.  Tradjenta 5 mg daily.  9.  Vitamin E 400 international units b.i.d.  10.  Lantus 56 units in the morning.  11.  Lactulose 4 tsp 3 to 4 times a day.  12.  Aldactone 100 mg, 1-1/2 tablets every other day alternating with 100 mg a day.  13.  Lasix 40 mg every other day alternating with 60 mg dose.   DISCHARGE HOME HEALTH: With a nurse.   DISCHARGE OXYGEN: None.   DISCHARGE DIET: Low sodium.   DISCHARGE FOLLOWUP:  The patient will follow up with Dr. Lovie Macadamia in 1 week.   CONDITION:  The patient is medically stable for discharge.   TIME SPENT:   35 minutes.  ____________________________ Donell Beers. Benjie Karvonen, MD spm:cb D: 11/28/2012 14:16:53 ET T: 11/28/2012 21:17:14 ET JOB#: [id number]  cc: Manasa Spease P. Benjie Karvonen, MD, <Dictator> Youlanda Roys. Lovie Macadamia, MD Donell Beers Emberlie Gotcher MD ELECTRONICALLY SIGNED 11/29/2012 15:14

## 2014-10-05 NOTE — Consult Note (Signed)
PATIENT NAME:  [name], [name] MR#:  [id number] DATE OF BIRTH:  18-Feb-1941  DATE OF CONSULTATION:  01/06/2013  REFERRING PHYSICIAN:  Zwingle Sink, MD CONSULTING PHYSICIAN: Lupita Leash GI, Payton Emerald, NP, and Lupita Dawn. Candace Cruise, MD   REASON FOR ADMISSION: Gastrointestinal bleeding.  PRIMARY CARE PHYSICIAN: Youlanda Roys. Lovie Macadamia, MD  PRIMARY GASTROENTEROLOGIST: Lupita Dawn. Candace Cruise, MD   HISTORY OF PRESENT ILLNESS: [name] is a 74 year old Caucasian gentleman who is well known to myself as well as Dr. Verdie Shire. He has a significant medical history of alcoholic cirrhosis. He was actually seen in our office yesterday in followup after having been admitted last week and seen in consultation by me as well as Dr. Verdie Shire for the concern of GI bleed at that time as well. He had had an EGD that was done on 12/26/2012 at [facility] by Dr. Mont Dutton for followup of his known history of esophageal varices. He had had 4 bands placed at that time. When he was seen in consultation by myself and Dr. Candace Cruise on 12/29/2012 for the concern of bright red blood per rectum/GI bleed, EGD was done at that time, which did not reveal bands actually being in place. It revealed 4 ulcerated areas where the bands had been placed, and actually a large blood caught was present to one of the ulcerated areas. Banding was redone. Yesterday, in followup, he did state that he was continuing to be fatigued. He was actually discharged this past Sunday, but he was discharged mostly on a clear liquid diet and had not increased or advanced his caloric intake at that time. He was adamant about stating that he had not had any further episodes of bright red blood per rectum. Daughter did state that neurologically he was confused today though. His blood pressure was slightly decreased at 80/40s in the office. He did receive 2 units of packed red blood cells during his prior admission. He is not experiencing any abdominal pain. No edema to his  lower extremities. He did complain though of a 6-pound weight loss, which his daughter does state that normally this occurs once he receives IV fluids.   He was discharged from our office yesterday, with laboratory studies having been done as well as 1 set of Hemoccult cards given to assess and see if active bleeding was still occurring. Unfortunately, yesterday evening, he started to notice evidence of bright red blood again at 5:00 p.m., apparently a very large amount of blood as noted by hospitalist's H and P. He did present to the Emergency Room. States that he was sleepy. In the Emergency Room, his blood pressure was found to be 90/40s. His blood pressure decreased to 76/40 several times and thus was admitted to the hospital. Dr. Arther Dames was consulted as he was on for our group yesterday evening. Recommended for him to be on a PPI as well as an octreotide drip. Dr. Verdie Shire will be taking over as we are familiar with his history.    The patient did have a paracentesis done on 12/05/2012 under the care of [facility]. He is under the care of Dr. Tamala Julian with the transplant team. Approximately 6 liters was withdrawn at that time. Yesterday, laboratory results as follows from our office: His hemoglobin was 9.2 with hematocrit of 28.6, MCV was 103.2, MCH was 33.2. WBC was 4.0. PT 15.6 with an INR of 1.5. Platelet count 59,000. Hepatic panel: Albumin was 3.0, alkaline phosphatase 75, total bilirubin  was 1.3 with a direct bilirubin of 0.4, total protein of 6.3, AST was 30, ALT was 18. BMP revealed BUN of 27, calcium of 8.4, creatinine is 1.7, glucose is 128, otherwise within normal limits.   PAST MEDICAL HISTORY:  1. Advanced liver disease, ascites with esophageal varices, on transplant list with Dr. Tamala Julian at [facility], history of recurrent hepatic encephalopathy, alcoholic cirrhosis.  2. History of alcoholism, quit in 2005.  3. Depression.  4. Diabetes mellitus.   5. Gout.  6. History of hernia.  7. Echocardiogram and stress echo both normal on 04/17/2010. Most recent echocardiogram does reveal evidence of possible early evidence of congestive heart failure. On 12/23/2012, echocardiogram did reveal evidence of normal left ventricular systolic function, mild LVH, normal right ventricular systolic function, valvular regurgitation, mild MR, trivial PR, trivial TR and valvular stenosis. Positive microcavitation study after a Valsalva suggests intracardiac shunting. Pending cardiac evaluation still at [facility]. 8. Chronic anemia.  9. Thrombocytopenia.  PAST SURGICAL HISTORY:  1. Inguinal hernia repair.  2. Surgery for pyloric stenosis at the age of 59 weeks.  3. Tonsillectomy.  4. Colonoscopy in 2010 unremarkable.  5. Multiple EGDs with esophageal variceal banding.   HOME MEDICATIONS:  1. Furosemide 80 mg once a day.  2. Spironolactone 100 mg 2 tablets once a day.  3. Lactulose 4 tablespoons 3 to 4 times a day.  4. Xifaxan 550 mg twice a day.  5. Vitamin E 400 mg twice a day. 6. Paxil 10 mg a day.  7. Omeprazole 40 mg a day. 8. Lantus 12 units daily.  9. Ferrous sulfate 325 mg twice a day.   ALLERGIES: None.   FAMILY HISTORY: Significant for alcohol abuse. No history of coronary artery disease or cancer.   SOCIAL HISTORY: Remote tobacco use. Has not drunk since 2005. No tobacco use. Resides with his daughter.   REVIEW OF SYSTEMS: From my office note yesterday, all 10 systems reviewed and checked, otherwise unremarkable other than what is stated.   PHYSICAL EXAMINATION: Represents my office note yesterday. VITAL SIGNS: Currently though are 98.2, pulse of 62, respirations are 22, blood pressure is 88/42 and pulse oximetry is 95%.  GENERAL: Well-developed, well-nourished 74 year old Caucasian gentleman who yesterday did appear to be feeling better, more alert, though his  color was still pale. Grade 1 evidence of asterixis.   HEENT: Normocephalic, atraumatic. Pupils equal, round and reactive to light. Conjunctivae clear. Sclerae anicteric.  NECK: Supple. Trachea midline. No lymphadenopathy or thyromegaly.  PULMONARY: Symmetric rise and fall of chest. Clear to auscultation throughout.  CARDIOVASCULAR: Regular rate and rhythm, S1, S2. No murmurs, no gallops.  ABDOMEN: Large, soft. Mild evidence of ascites. Difficult to assess adequate evidence of hepatosplenomegaly. No rebound tenderness.  RECTAL: Deferred, though Hemoccult cards were given yesterday, and the results are not available.  EXTREMITIES: Mild edema bilaterally, nonpitting.  NEUROLOGICAL: No gross neurological deficit noted.  PSYCHIATRIC: Alert and oriented x4 during interviewing process, though daughter did state that he was confused as to why he was in our office yesterday.   LABORATORY AND DIAGNOSTIC DATA: Chemistry panel on admission: Glucose is 228, BUN was 26, creatinine was 1.87, EGFR was 35, anion gap was 4. Comparison today: The glucose has improved to 125, BUN is 25, creatinine is 1.77, EGFR is 38. Calcium dropped from 8.5 to 8.2. Hepatic panel: Albumin is 2.8 on admission with a total bilirubin 1.3, AST was 41, otherwise within normal limits. Albumin this morning 2.5 with  a total bilirubin of 1.6. Hemoglobin on admission 9.6 with hematocrit of 28.1. Trending hemoglobin dropped to 8.4 after admission, and currently 9.0, with hematocrit of 26.6. Platelet count 50,000 on admission, currently 48,000. Antibody screen was positive. PT is 17.8 with an INR of 1.5. Urinalysis essentially unremarkable, although hyaline casts are 74 per low-power field. Single-view chest x-ray: Left-sided central venous catheter with the tip projecting over the SVC. There is mild bilateral interstitial thickening concerning for mild interstitial edema. No focal parenchymal opacity, pleural effusion or pneumothorax. Normal cardiomediastinal silhouette. Osseous structures are  unremarkable.   IMPRESSION:  1. Gastrointestinal bleed, suspected related to ulcerated areas found on esophagogastroduodenoscopy last week, which was noted after having 4 bands placed at [facility] for esophageal varices.  2. Known history of alcoholic cirrhosis with hepatic encephalopathy. 3. Chronic anemia.  4. Hypotension.  5. Thrombocytopenia.   PLAN: The patient's presentation is discussed with Dr. Verdie Shire. He is already aware of the patient's admission. The patient will be evaluated by Dr. Candace Cruise with the goal of proceeding possibly forward with an EGD today to allow direct luminal evaluation of ulcerated areas as found last week. Will continue to monitor closely. PPI therapy to remain in place as well as octreotide drip. Transfuse as necessary.   The patient was scheduled for an abdominal ultrasound today on an outpatient basis to evaluate for evidence of ascites as concern did exist yesterday in the office. Paracentesis may be warranted again at this time after having received IV fluids last week. Do recommend at some point consideration of abdominal ultrasound being done while he is in the hospital.   These services provided by Payton Emerald, MS, APRN, [facility], FNP, under collaborative agreement with Dr. Verdie Shire.  ____________________________ Payton Emerald, NP dsh:OSi D: 01/06/2013 11:22:43 ET T: 01/06/2013 12:27:33 ET JOB#: [id number]  cc: Payton Emerald, NP, <Dictator> Payton Emerald MD ELECTRONICALLY SIGNED 01/06/2013 14:22

## 2014-10-05 NOTE — Discharge Summary (Signed)
PATIENT NAMENAHEEM, [name] MR#:  [id number] DATE OF BIRTH:  06-16-1940  DATE OF ADMISSION:  12/10/2012 DATE OF DISCHARGE:  12/11/2012  ADMITTING DIAGNOSIS: Hepatic encephalopathy.   DISCHARGE DIAGNOSES: 1.  Recurrent hepatic encephalopathy.  2.  Chronic renal failure.  3.  Liver cirrhosis with ascites, status post recent paracentesis.  4. Coagulopathy due to liver disease.  5. Insulin-dependent diabetes mellitus.  6.  Depression.  7.  Anemia.  8.  History of (Dictation Anomaly) abdominal wal laceration and  bleed in the past.   DISCHARGE CONDITION: Stable.   DISCHARGE MEDICATIONS: The patient is to resume  Xifaxan 550 mg p.o. twice daily, Paxil 10 mg p.o. daily, spironolactone 200 mg p.o. daily, furosemide 80 mg p.o. daily, iron sulfate 325 mg p.o. twice daily, vitamin E 400 units twice daily, Lantus 56 units subcutaneously daily, omeprazole 40 mg p.o. daily, lactulose 20 mg every 6 hours.   HOME OXYGEN:  None.   DIET: 2 grams salt, low-fat, low-cholesterol, carbohydrate -controlled diet, regular consistency.   ACTIVITY LIMITATIONS: As tolerated.   FOLLOW-UP: Appointment with Dr. Lovie Macadamia in 2 days after discharge. The patient discharged to Dr. Lovie Macadamia. The patient is also to follow up with primary GI specialist at [facility] on 12/19/2012, according to his prior appointment.   CONSULTANTS:  Dr. Candace Cruise, care management.   RADIOLOGIC STUDIES: None.   HOSPITAL COURSE: The patient is a 74 year old male with past medical history significant for history of liver disease and hepatic encephalopathy, who presented to the hospital with complaints of confusion. Please refer to Dr. Ward Givens admission on the 12/10/2012. Apparently, the patient had multiple admissions in the past few months for hepatic encephalopathy and per patient as well as daughter, they were very compliant with medications. However, apparently, patient had paracentesis done one week ago where he had approximately 6 liters of  fluid removed. With paracentesis, the patient had (Dictation Anomaly) albumin infusion. Since then, he was having increasing confusion, despite use of his medications. On arrival to the Emergency Room, his vitals were stable and overall his exam was unremarkable except for ascites. The patient's lab data done on admission showed a BUN and creatinine of 36 and 1.68, sodium 135, glucose 201; otherwise, BMP was unremarkable. Ammonia level was high at 184. Liver enzymes were remarkable only for mild elevation of total bilirubin to 1.1 and albumin level of 3.0. White blood cell count was normal at 4.0, hemoglobin was 8.9, platelet count was 49, MCV of 102. Urinalysis was unremarkable. The patient was admitted to the  hospital for further evaluation. He was started on antibiotic therapy as well as increased doses of lactulose. Consultation with Dr. Candace Cruise was obtained. Dr. Candace Cruise felt the patient may need to take lactulose 4 times a day instead of 3 times a day. He is to continue, however Xifaxan, and follow-up with his primary gastroenterologist at [facility]. With this therapy 4 times daily with lactulose, the patient's condition improved and he returned back to normal self. On the day of discharge, his vital signs stable his temperature is 98, pulse 60 to 70, respiration rate was 18 to 20, blood pressure ranging from 93 to 595 systolic and 63O to 75I and diastolic and oxygen saturation was 99% on room air at rest.   TIME SPENT: 40 minutes    ____________________________ Theodoro Grist, MD rv:cc D: 12/11/2012 15:59:49 ET T: 12/11/2012 23:23:07 ET JOB#: [id number]  cc: Theodoro Grist, MD, <Dictator> Dawnyel Leven MD ELECTRONICALLY SIGNED 12/23/2012 17:41

## 2014-10-05 NOTE — Consult Note (Signed)
Pt stable when seen at the office yest. However, one episode of rectal bleeding 1 hr afterwards with drop in hgb. No further bleeding since. EGD showed that the esophageal band that I placed last week at the bleeding site has fallen off. It probably bled again though no active bleeding now. New band placed though hard to suction. Another band that I placed last week is still present. Start clears. Continue octreotide drip. Continue dopamine for pressor support. Resume lactulose. Will follow. Thanks.   Electronic Signatures: Verdie Shire (MD) (Signed on 25-Jul-14 12:10)  Authored   Last Updated: 25-Jul-14 12:11 by Verdie Shire (MD)

## 2014-10-05 NOTE — Consult Note (Signed)
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  On transplant service at [facility].  Esophageal varices with banding 12/26/2012.  GI bleed.  Thrombocytopenia.  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   Discussed with Attending MD.   Comments: Patient's presentation was discussed with Dr. Verdie Shire.  Will proceed with EGD this afternoon to assess for source of blood loss.  Patient at increase risk given known history of esophageal varices and recent banding. Possible banding may have slipped.  Discussed with patient as well as his family members.  Order placed, risk versus benefits discussed.  Will continue to monitor H/H.  Transfuse as necessary.  May need to proceed with colonoscopy.  Patient is at increase risk for rectal varices.  Electronic Signatures: Payton Emerald (NP)  (Signed 17-Jul-14 12:36)  Authored: Brief Consult Note   Last Updated: 17-Jul-14 12:36 by Payton Emerald (NP)

## 2014-12-20 DIAGNOSIS — M5030 Other cervical disc degeneration, unspecified cervical region: Secondary | ICD-10-CM | POA: Insufficient documentation

## 2015-03-27 DIAGNOSIS — I340 Nonrheumatic mitral (valve) insufficiency: Secondary | ICD-10-CM | POA: Insufficient documentation

## 2015-04-09 DIAGNOSIS — E782 Mixed hyperlipidemia: Secondary | ICD-10-CM | POA: Insufficient documentation

## 2015-08-25 ENCOUNTER — Ambulatory Visit
Admission: EM | Admit: 2015-08-25 | Discharge: 2015-08-25 | Disposition: A | Discharge status: Home / Self Care | Payer: Medicare Other | Attending: Family Medicine | Admitting: Family Medicine

## 2015-08-25 DIAGNOSIS — H66001 Acute suppurative otitis media without spontaneous rupture of ear drum, right ear: Secondary | ICD-10-CM | POA: Diagnosis not present

## 2015-08-25 HISTORY — DX: Liver disease, unspecified: K76.9

## 2015-08-25 HISTORY — DX: Unspecified cirrhosis of liver: K74.60

## 2015-08-25 MED ORDER — AMOXICILLIN 500 MG PO CAPS: 500.0000 mg | ORAL_CAPSULE | Freq: Two times a day (BID) | ORAL | Status: DC

## 2015-08-25 NOTE — ED Notes (Signed)
Patient c/o right ear pain and sinus pressure.  He states that he is a liver transplant patient and denies fever,c/n/v or chest pain.

## 2015-08-25 NOTE — ED Provider Notes (Signed)
CSN: [id number]     Arrival date & time 08/25/15  [id number] History   First MD Initiated Contact with Patient 08/25/15 1059     Chief Complaint  Patient presents with  . Otalgia    Right Ear Pain   HPI  [name] is a pleasant 75 y.o. male who presents With right ear pain. He states he has had fullness and feels sinus pressure in the frontal area. The right ear pain and fullness started last night. He denies any recent upper or infections. He is status post liver transplant at [facility] December 2014 is followed by Dr. Edison Pace. He denies any recent complications from transplant. Denies fever or chills. Pain is 5/10 on pain scale. He has not tried any over-the-counter medications due to his cirrhosis. Nothing seems to make the pain better or worse.  Past Medical History  Diagnosis Date  . Liver disease   . Cirrhosis of liver [facility])    Past Surgical History  Procedure Laterality Date  . Hernia repair    . Liver transplant surgery     History reviewed. No pertinent family history. Social History  Substance Use Topics  . Smoking status: Never Smoker   . Smokeless tobacco: Never Used  . Alcohol Use: No    Review of Systems  Constitutional: Negative.   HENT: Positive for ear pain and sinus pressure. Negative for congestion, ear discharge, postnasal drip, rhinorrhea, sore throat and trouble swallowing.   Eyes: Negative.   Cardiovascular: Negative.   Endocrine: Negative.   Genitourinary: Negative.   Musculoskeletal: Negative.   Skin: Negative.   Allergic/Immunologic: Negative.   Neurological: Negative.   Psychiatric/Behavioral: Negative.     Allergies  Review of patient's allergies indicates no known allergies.  Home Medications   Prior to Admission medications   Medication Sig Start Date End Date Taking? Authorizing Provider  Calcium Citrate-Vitamin D (CALCIUM + D PO) Take by mouth.   Yes Historical Provider, MD  cycloSPORINE (SANDIMMUNE) 100 MG capsule Take  75 mg by mouth 2 (two) times daily.   Yes Historical Provider, MD  MAGNESIUM OXIDE PO Take by mouth.   Yes Historical Provider, MD  mycophenolate (CELLCEPT) 250 MG capsule Take 250 mg by mouth 2 (two) times daily.   Yes Historical Provider, MD  prazosin (MINIPRESS) 5 MG capsule Take 5 mg by mouth at bedtime.   Yes Historical Provider, MD  predniSONE (DELTASONE) 5 MG tablet Take 5 mg by mouth daily with breakfast.   Yes Historical Provider, MD  venlafaxine (EFFEXOR) 37.5 MG tablet Take 37.5 mg by mouth daily.   Yes Historical Provider, MD  amoxicillin (AMOXIL) 500 MG capsule Take 1 capsule (500 mg total) by mouth 2 (two) times daily. 08/25/15   Andria Meuse, NP   Meds Ordered and Administered this Visit  Medications - No data to display  BP 114/68 mmHg  Pulse 65  Temp(Src) 98.2 F (36.8 C) (Oral)  Resp 16  Ht 5\' 8"  (1.727 m)  Wt 200 lb (90.719 kg)  BMI 30.42 kg/m2  SpO2 99% No data found.   Physical Exam  Constitutional: He is oriented to person, place, and time. He appears well-developed and well-nourished. No distress.  HENT:  Head: Normocephalic and atraumatic.  Right Ear: Hearing and ear canal normal. There is swelling. Tympanic membrane is injected, erythematous and bulging. Tympanic membrane is not perforated. A middle ear effusion is present.  Left Ear: Hearing, tympanic membrane, external ear and ear canal normal.  Nose: Nose normal. Right sinus exhibits no maxillary sinus tenderness and no frontal sinus tenderness. Left sinus exhibits no maxillary sinus tenderness and no frontal sinus tenderness.  Mouth/Throat: Uvula is midline, oropharynx is clear and moist and mucous membranes are normal.  Eyes: Conjunctivae are normal. No scleral icterus.  Neck: Normal range of motion.  Cardiovascular: Normal rate and regular rhythm.   Pulmonary/Chest: Effort normal and breath sounds normal. No tachypnea. No respiratory distress.  Abdominal: Soft. Normal appearance and bowel sounds are  normal. He exhibits no distension.  Musculoskeletal: Normal range of motion. He exhibits no edema or tenderness.  Neurological: He is alert and oriented to person, place, and time. No cranial nerve deficit.  Skin: Skin is warm and dry. No rash noted. He is not diaphoretic. No erythema.  Psychiatric: His behavior is normal. Judgment normal.  Nursing note and vitals reviewed.   ED Course  Procedures NA  Labs Review Labs Reviewed - No data to display  MDM   1. Acute suppurative otitis media of right ear without spontaneous rupture of tympanic membrane, recurrence not specified    Plan: diagnosis reviewed with patient/parent/guardian/caregiver  Rx as per orders;  benefits, risks, potential side effects reviewed  Recommend supportive treatment with rest, increase fluids Seek additional medical care if symptoms worsen or are not improving Patient advised to follow-up in one week with his PCP for ear recheck    Andria Meuse, NP 08/25/15 1128

## 2015-08-25 NOTE — Discharge Instructions (Signed)

## 2015-10-04 ENCOUNTER — Encounter: Payer: Self-pay | Admitting: Urgent Care

## 2015-10-04 ENCOUNTER — Emergency Department
Admission: EM | Admit: 2015-10-04 | Discharge: 2015-10-05 | Disposition: A | Discharge status: Home / Self Care | Payer: Medicare Other | Attending: Emergency Medicine | Admitting: Emergency Medicine

## 2015-10-04 DIAGNOSIS — R739 Hyperglycemia, unspecified: Secondary | ICD-10-CM

## 2015-10-04 DIAGNOSIS — Z79899 Other long term (current) drug therapy: Secondary | ICD-10-CM | POA: Diagnosis not present

## 2015-10-04 DIAGNOSIS — N289 Disorder of kidney and ureter, unspecified: Secondary | ICD-10-CM | POA: Diagnosis not present

## 2015-10-04 DIAGNOSIS — E1165 Type 2 diabetes mellitus with hyperglycemia: Secondary | ICD-10-CM | POA: Diagnosis not present

## 2015-10-04 DIAGNOSIS — I4891 Unspecified atrial fibrillation: Secondary | ICD-10-CM | POA: Insufficient documentation

## 2015-10-04 HISTORY — DX: Unspecified atrial fibrillation: I48.91

## 2015-10-04 HISTORY — DX: Type 2 diabetes mellitus without complications: E11.9

## 2015-10-04 LAB — GLUCOSE, CAPILLARY
GLUCOSE-CAPILLARY: 349 mg/dL — AB (ref 65–99)
Glucose-Capillary: 526 mg/dL — ABNORMAL HIGH (ref 65–99)

## 2015-10-04 LAB — BASIC METABOLIC PANEL
ANION GAP: 8 (ref 5–15)
BUN: 68 mg/dL — ABNORMAL HIGH (ref 6–20)
CALCIUM: 8.6 mg/dL — AB (ref 8.9–10.3)
CO2: 25 mmol/L (ref 22–32)
Chloride: 104 mmol/L (ref 101–111)
Creatinine, Ser: 1.67 mg/dL — ABNORMAL HIGH (ref 0.61–1.24)
GFR, EST AFRICAN AMERICAN: 45 mL/min — AB (ref >60)
GFR, EST NON AFRICAN AMERICAN: 39 mL/min — AB (ref >60)
Glucose, Bld: 501 mg/dL (ref 65–99)
Potassium: 4.6 mmol/L (ref 3.5–5.1)
SODIUM: 137 mmol/L (ref 135–145)

## 2015-10-04 LAB — CBC
HCT: 38.8 % — ABNORMAL LOW (ref 40.0–52.0)
HEMOGLOBIN: 13.3 g/dL (ref 13.0–18.0)
MCH: 32.2 pg (ref 26.0–34.0)
MCHC: 34.2 g/dL (ref 32.0–36.0)
MCV: 94.3 fL (ref 80.0–100.0)
PLATELETS: 108 10*3/uL — AB (ref 150–440)
RBC: 4.11 MIL/uL — AB (ref 4.40–5.90)
RDW: 13.5 % (ref 11.5–14.5)
WBC: 3.4 10*3/uL — AB (ref 3.8–10.6)

## 2015-10-04 MED ORDER — INSULIN ASPART 100 UNIT/ML ~~LOC~~ SOLN
6.0000 [IU] | Freq: Once | SUBCUTANEOUS | Status: AC
  Administered 2015-10-04: 6 [IU] via INTRAVENOUS
  Filled 2015-10-04: qty 6

## 2015-10-04 MED ORDER — SODIUM CHLORIDE 0.9 % IV BOLUS (SEPSIS): 1000.0000 mL | Freq: Once | INTRAVENOUS | Status: DC

## 2015-10-04 MED ORDER — SODIUM CHLORIDE 0.9 % IV BOLUS (SEPSIS)
1000.0000 mL | Freq: Once | INTRAVENOUS | Status: AC
  Administered 2015-10-04: 1000 mL via INTRAVENOUS

## 2015-10-04 NOTE — ED Provider Notes (Signed)
[facility] Emergency Department Provider Note  ____________________________________________  Time seen: Approximately 11:24 PM  I have reviewed the triage vital signs and the nursing notes.   HISTORY  Chief Complaint Hyperglycemia    HPI [name] is a 75 y.o. 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  He received an intramuscular injection of steroids in his neck earlier today.  He reports in the morning, before the injection, his blood sugar was 120.  Later this evening he checked his blood sugar (he usually checks 3 times a day) and his blood sugar was over 500.  He became worried and discussed with his daughter and they decided to come to the emergency department for evaluation.  He denies fever/chills, chest pain, shortness of breath, nausea, vomiting, diarrhea, abdominal pain, dysuria.  He has not had any visual changes or headache.  He has had a least one episode of sweating but no other symptoms.  He has a history of liver transplant 3 years ago and takes immunosuppression as a result.  He uses long-acting insulin in the morning and has NovoLog that he uses as needed on a sliding scale, although typically does not need to.  He states that his sliding scale maxes out at a blood sugar of 450, so he is not sure what to do or how much she gave himself.   Past Medical History  Diagnosis Date  . Liver disease   . Cirrhosis of liver ([place])   . Diabetes mellitus without complication ([place])   . Atrial fibrillation ([place])     There are no active problems to display for this patient.   Past Surgical History  Procedure Laterality Date  . Hernia repair    . Liver transplant surgery      Current Outpatient Rx  Name  Route  Sig  Dispense  Refill  . amoxicillin (AMOXIL) 500 MG capsule   Oral   Take 1 capsule (500 mg total) by mouth 2 (two) times daily.   14 capsule   0   . Calcium Citrate-Vitamin D (CALCIUM  + D PO)   Oral   Take by mouth.         . cycloSPORINE (SANDIMMUNE) 100 MG capsule   Oral   Take 75 mg by mouth 2 (two) times daily.         Marland Kitchen MAGNESIUM OXIDE PO   Oral   Take by mouth.         . mycophenolate (CELLCEPT) 250 MG capsule   Oral   Take 250 mg by mouth 2 (two) times daily.         . prazosin (MINIPRESS) 5 MG capsule   Oral   Take 5 mg by mouth at bedtime.         . predniSONE (DELTASONE) 5 MG tablet   Oral   Take 5 mg by mouth daily with breakfast.         . venlafaxine (EFFEXOR) 37.5 MG tablet   Oral   Take 37.5 mg by mouth daily.           Allergies Review of patient's allergies indicates no known allergies.  No family history on file.  Social History Social History  Substance Use Topics  . Smoking status: Never Smoker   . Smokeless tobacco: Never Used  . Alcohol Use: No    Review of Systems Constitutional: No fever/chills Eyes: No visual changes. ENT: No sore throat. Cardiovascular: Denies chest pain.  Respiratory: Denies shortness of breath. Gastrointestinal: No abdominal pain.  No nausea, no vomiting.  No diarrhea.  No constipation. Genitourinary: Negative for dysuria. Musculoskeletal: Negative for back pain. Skin: Negative for rash. Neurological: Negative for headaches, focal weakness or numbness.  10-point ROS otherwise negative.  ____________________________________________   PHYSICAL EXAM:  VITAL SIGNS: ED Triage Vitals  Enc Vitals Group     BP 10/04/15 2214 178/79 mmHg     Pulse Rate 10/04/15 2214 61     Resp 10/04/15 2214 99     Temp 10/04/15 2214 97.9 F (36.6 C)     Temp Source 10/04/15 2214 Oral     SpO2 10/04/15 2300 95 %     Weight 10/04/15 2214 210 lb (95.255 kg)     Height 10/04/15 2214 5\' 8"  (1.727 m)     Head Cir --      Peak Flow --      Pain Score 10/04/15 2216 0     Pain Loc --      Pain Edu? --      Excl. in [place]? --     Constitutional: Alert and oriented. Well appearing and in no acute  distress. Eyes: Conjunctivae are normal. PERRL. EOMI. Head: Atraumatic. Nose: No congestion/rhinnorhea. Mouth/Throat: Mucous membranes are moist.  Oropharynx non-erythematous. Neck: No stridor.  No meningeal signs.   Cardiovascular: Normal rate, regular rhythm. Good peripheral circulation. Grossly normal heart sounds.   Respiratory: Normal respiratory effort.  No retractions. Lungs CTAB. Gastrointestinal: Soft and nontender. No distention.  Musculoskeletal: No lower extremity tenderness nor edema. No gross deformities of extremities. Neurologic:  Normal speech and language. No gross focal neurologic deficits are appreciated.  Skin:  Skin is warm, dry and intact. No rash noted. Psychiatric: Mood and affect are normal. Speech and behavior are normal.  ____________________________________________   LABS (all labs ordered are listed, but only abnormal results are displayed)  Labs Reviewed  GLUCOSE, CAPILLARY - Abnormal; Notable for the following:    Glucose-Capillary 526 (*)    All other components within normal limits  BASIC METABOLIC PANEL - Abnormal; Notable for the following:    Glucose, Bld 501 (*)    BUN 68 (*)    Creatinine, Ser 1.67 (*)    Calcium 8.6 (*)    GFR calc non Af Amer 39 (*)    GFR calc Af Amer 45 (*)    All other components within normal limits  CBC - Abnormal; Notable for the following:    WBC 3.4 (*)    RBC 4.11 (*)    HCT 38.8 (*)    Platelets 108 (*)    All other components within normal limits  GLUCOSE, CAPILLARY - Abnormal; Notable for the following:    Glucose-Capillary 349 (*)    All other components within normal limits  CBG MONITORING, ED  CBG MONITORING, ED   ____________________________________________  EKG  ED ECG REPORT I, Mohanad Carsten, the attending physician, personally viewed and interpreted this ECG.  Date: 10/05/2015 EKG Time: 22:24 Rate: 55 Rhythm: Sinus bradycardia QRS Axis: normal Intervals: normal ST/T Wave abnormalities:  Inverted T-wave in lead 3, otherwise unremarkable Conduction Disturbances: none Narrative Interpretation: unremarkable  ____________________________________________  RADIOLOGY   No results found.  ____________________________________________   PROCEDURES  Procedure(s) performed: None  Critical Care performed: No ____________________________________________   INITIAL IMPRESSION / ASSESSMENT AND PLAN / ED COURSE  Pertinent labs & imaging results that were available during my care of the patient were reviewed by me and considered  in my medical decision making (see chart for details).  I had a long conversation with the patient and his daughter about hyperglycemia.  I explained that I do think that this is likely the result of the steroid injection in his neck.  Given that his vital signs are reassuring and unremarkable and that his lab workup is reassuring as well, I do not believe he needs to stay in the hospital.  We gave him regular insulin 6 units IV and 1 L of IV fluids which brought his blood sugar down to the mid 300s.  I explained that any additional insulin might lower his sugar some more but it is essentially a false reading and that it will likely go back up.  I think a safer option for him is to not have anything else to eat tonight and check his blood sugar again in the morning and use the sliding scale insulin as advised by his physician.  I encouraged close outpatient follow-up.  I also explained that his kidneys are functioning slightly less well than they were last time for [place] and that he needs to be sure and drink plenty of water and follow up as an outpatient next week for repeat labs to make sure his kidney function is appropriate.  He and his daughter understand and agree with his plan.  ____________________________________________  FINAL CLINICAL IMPRESSION(S) / ED DIAGNOSES  Final diagnoses:  Acute hyperglycemia  Renal insufficiency      NEW MEDICATIONS  STARTED DURING THIS VISIT:  Discharge Medication List as of 10/05/2015 12:10 AM        Note:  This document was prepared using Dragon voice recognition software and may include unintentional dictation errors.   Hinda Kehr, MD 10/05/15 [phone]

## 2015-10-04 NOTE — ED Notes (Signed)
Patient presents with reports of elevated glucose s/p CT of neck at [facility]. Patient reporting CBGs of 565. Patient has taken 12 units of Novolog since obtaining that reading. Patient CAO x 4. Denies blurred vision. (+) diaphoresis.

## 2015-10-05 NOTE — Discharge Instructions (Signed)
As we discussed, we believe you are elevated blood sugar is due to the steroid injection you received.  This elevation will likely persist for at least several days.  We encourage you to stick very carefully to your diabetic diet and use your sliding scale insulin as needed.  Follow up with your primary care doctor at the next available opportunity for a follow-up visit and likely repeat outpatient blood work.  Your kidney function has decreased somewhat tonight compared to the last time we have lab work on you, but that was more than a year and a half ago, so we do not have current information and your kidney function tonight may be your new normal.  Either way it is important that you follow-up for reassessment early next week.  Return to the emergency department if you develop new or worsening symptoms that concern you.   Hyperglycemia Hyperglycemia occurs when the glucose (sugar) in your blood is too high. Hyperglycemia can happen for many reasons, but it most often happens to people who do not know they have diabetes or are not managing their diabetes properly.  CAUSES  Whether you have diabetes or not, there are other causes of hyperglycemia. Hyperglycemia can occur when you have diabetes, but it can also occur in other situations that you might not be as aware of, such as: Diabetes  If you have diabetes and are having problems controlling your blood glucose, hyperglycemia could occur because of some of the following reasons:  Not following your meal plan.  Not taking your diabetes medications or not taking it properly.  Exercising less or doing less activity than you normally do.  Being sick. Pre-diabetes  This cannot be ignored. Before people develop Type 2 diabetes, they almost always have "pre-diabetes." This is when your blood glucose levels are higher than normal, but not yet high enough to be diagnosed as diabetes. Research has shown that some long-term damage to the body,  especially the heart and circulatory system, may already be occurring during pre-diabetes. If you take action to manage your blood glucose when you have pre-diabetes, you may delay or prevent Type 2 diabetes from developing. Stress  If you have diabetes, you may be "diet" controlled or on oral medications or insulin to control your diabetes. However, you may find that your blood glucose is higher than usual in the hospital whether you have diabetes or not. This is often referred to as "stress hyperglycemia." Stress can elevate your blood glucose. This happens because of hormones put out by the body during times of stress. If stress has been the cause of your high blood glucose, it can be followed regularly by your caregiver. That way he/she can make sure your hyperglycemia does not continue to get worse or progress to diabetes. Steroids  Steroids are medications that act on the infection fighting system (immune system) to block inflammation or infection. One side effect can be a rise in blood glucose. Most people can produce enough extra insulin to allow for this rise, but for those who cannot, steroids make blood glucose levels go even higher. It is not unusual for steroid treatments to "uncover" diabetes that is developing. It is not always possible to determine if the hyperglycemia will go away after the steroids are stopped. A special blood test called an A1c is sometimes done to determine if your blood glucose was elevated before the steroids were started. SYMPTOMS  Thirsty.  Frequent urination.  Dry mouth.  Blurred vision.  Tired or  fatigue.  Weakness.  Sleepy.  Tingling in feet or leg. DIAGNOSIS  Diagnosis is made by monitoring blood glucose in one or all of the following ways:  A1c test. This is a chemical found in your blood.  Fingerstick blood glucose monitoring.  Laboratory results. TREATMENT  First, knowing the cause of the hyperglycemia is important before the  hyperglycemia can be treated. Treatment may include, but is not be limited to:  Education.  Change or adjustment in medications.  Change or adjustment in meal plan.  Treatment for an illness, infection, etc.  More frequent blood glucose monitoring.  Change in exercise plan.  Decreasing or stopping steroids.  Lifestyle changes. HOME CARE INSTRUCTIONS   Test your blood glucose as directed.  Exercise regularly. Your caregiver will give you instructions about exercise. Pre-diabetes or diabetes which comes on with stress is helped by exercising.  Eat wholesome, balanced meals. Eat often and at regular, fixed times. Your caregiver or nutritionist will give you a meal plan to guide your sugar intake.  Being at an ideal weight is important. If needed, losing as little as 10 to 15 pounds may help improve blood glucose levels. SEEK MEDICAL CARE IF:   You have questions about medicine, activity, or diet.  You continue to have symptoms (problems such as increased thirst, urination, or weight gain). SEEK IMMEDIATE MEDICAL CARE IF:   You are vomiting or have diarrhea.  Your breath smells fruity.  You are breathing faster or slower.  You are very sleepy or incoherent.  You have numbness, tingling, or pain in your feet or hands.  You have chest pain.  Your symptoms get worse even though you have been following your caregiver's orders.  If you have any other questions or concerns.   This information is not intended to replace advice given to you by your health care provider. Make sure you discuss any questions you have with your health care provider.   Document Released: 11/25/2000 Document Revised: 08/24/2011 Document Reviewed: 02/05/2015 Elsevier Interactive Patient Education 2016 [employer]   Chronic Kidney Disease Chronic kidney disease happens when the kidneys are damaged over a long period. The kidneys are two organs that do many important jobs in the body. These jobs  include:  Removing wastes and extra fluids from the blood.  Making hormones that help to keep the body healthy.  Making sure that the body has the right amount of fluids and chemicals. Chronic kidney disease may be caused by many things. The kidney damage occurs slowly. If too much damage occurs, the kidneys may stop working the way that they should. This is dangerous. Treatment can help to slow down the damage and keep it from getting worse. HOME CARE  Follow your diet as told by your doctor. You may need to limit the amount of salt (sodium) and protein that you eat each day.  Take medicines only as told by your doctor. Do not take any new medicines unless your doctor approves it.  Quit smoking if you smoke. Talk to your doctor about programs that may help you quit smoking.  Have your blood pressure checked regularly and keep track of the results.  Start or keep doing an exercise plan.  Get shots (immunizations) as told by your doctor.  Take vitamins and minerals as told by your doctor.  Keep all follow-up visits as told by your doctor. This is important. GET HELP RIGHT AWAY IF:   Your symptoms get worse.  You have new symptoms.  You have symptoms of end-stage kidney disease. These include:  Headaches.  Skin that is darker or lighter than normal.  Numbness in the hands or feet.  Easy bruising.  Frequent hiccups.  Stopping of menstrual periods in women.  You have a fever.  You are making very little pee (urine).  You have pain or bleeding when you pee.   This information is not intended to replace advice given to you by your health care provider. Make sure you discuss any questions you have with your health care provider.   Document Released: 08/26/2009 Document Revised: 02/20/2015 Document Reviewed: 01/29/2012 Elsevier Interactive Patient Education [employer].

## 2016-05-14 DIAGNOSIS — I10 Essential (primary) hypertension: Secondary | ICD-10-CM | POA: Insufficient documentation

## 2016-05-16 ENCOUNTER — Emergency Department: Payer: Medicare Other

## 2016-05-16 ENCOUNTER — Encounter: Payer: Self-pay | Admitting: Emergency Medicine

## 2016-05-16 ENCOUNTER — Observation Stay
Admission: EM | Admit: 2016-05-16 | Discharge: 2016-05-17 | Disposition: A | Discharge status: Home / Self Care | Payer: Medicare Other | Attending: Internal Medicine | Admitting: Internal Medicine

## 2016-05-16 DIAGNOSIS — I480 Paroxysmal atrial fibrillation: Principal | ICD-10-CM

## 2016-05-16 DIAGNOSIS — R079 Chest pain, unspecified: Secondary | ICD-10-CM

## 2016-05-16 DIAGNOSIS — Z944 Liver transplant status: Secondary | ICD-10-CM

## 2016-05-16 DIAGNOSIS — D899 Disorder involving the immune mechanism, unspecified: Secondary | ICD-10-CM

## 2016-05-16 DIAGNOSIS — R9431 Abnormal electrocardiogram [ECG] [EKG]: Secondary | ICD-10-CM

## 2016-05-16 DIAGNOSIS — D849 Immunodeficiency, unspecified: Secondary | ICD-10-CM

## 2016-05-16 DIAGNOSIS — K7460 Unspecified cirrhosis of liver: Secondary | ICD-10-CM | POA: Insufficient documentation

## 2016-05-16 DIAGNOSIS — E875 Hyperkalemia: Secondary | ICD-10-CM

## 2016-05-16 DIAGNOSIS — E119 Type 2 diabetes mellitus without complications: Secondary | ICD-10-CM | POA: Diagnosis not present

## 2016-05-16 DIAGNOSIS — R55 Syncope and collapse: Secondary | ICD-10-CM | POA: Diagnosis not present

## 2016-05-16 HISTORY — DX: Cardiac arrhythmia, unspecified: I49.9

## 2016-05-16 LAB — BASIC METABOLIC PANEL
Anion gap: 7 (ref 5–15)
BUN: 49 mg/dL — AB (ref 6–20)
CHLORIDE: 108 mmol/L (ref 101–111)
CO2: 21 mmol/L — AB (ref 22–32)
CREATININE: 1.37 mg/dL — AB (ref 0.61–1.24)
Calcium: 9.2 mg/dL (ref 8.9–10.3)
GFR calc Af Amer: 57 mL/min — ABNORMAL LOW (ref >60)
GFR calc non Af Amer: 49 mL/min — ABNORMAL LOW (ref >60)
Glucose, Bld: 211 mg/dL — ABNORMAL HIGH (ref 65–99)
POTASSIUM: 5.9 mmol/L — AB (ref 3.5–5.1)
SODIUM: 136 mmol/L (ref 135–145)

## 2016-05-16 LAB — RENAL FUNCTION PANEL
Albumin: 3.7 g/dL (ref 3.5–5.0)
Anion gap: 6 (ref 5–15)
BUN: 49 mg/dL — ABNORMAL HIGH (ref 6–20)
CO2: 21 mmol/L — ABNORMAL LOW (ref 22–32)
Calcium: 8.6 mg/dL — ABNORMAL LOW (ref 8.9–10.3)
Chloride: 110 mmol/L (ref 101–111)
Creatinine, Ser: 1.43 mg/dL — ABNORMAL HIGH (ref 0.61–1.24)
GFR calc Af Amer: 54 mL/min — ABNORMAL LOW
GFR calc non Af Amer: 47 mL/min — ABNORMAL LOW
Glucose, Bld: 178 mg/dL — ABNORMAL HIGH (ref 65–99)
Phosphorus: 3.2 mg/dL (ref 2.5–4.6)
Potassium: 5 mmol/L (ref 3.5–5.1)
Sodium: 137 mmol/L (ref 135–145)

## 2016-05-16 LAB — GLUCOSE, CAPILLARY: Glucose-Capillary: 161 mg/dL — ABNORMAL HIGH (ref 65–99)

## 2016-05-16 LAB — CBC
HEMATOCRIT: 43.7 % (ref 40.0–52.0)
Hemoglobin: 15.1 g/dL (ref 13.0–18.0)
MCH: 32.3 pg (ref 26.0–34.0)
MCHC: 34.6 g/dL (ref 32.0–36.0)
MCV: 93.3 fL (ref 80.0–100.0)
PLATELETS: 166 10*3/uL (ref 150–440)
RBC: 4.68 MIL/uL (ref 4.40–5.90)
RDW: 13.7 % (ref 11.5–14.5)
WBC: 7.1 10*3/uL (ref 3.8–10.6)

## 2016-05-16 LAB — TSH: TSH: 2.849 u[IU]/mL (ref 0.350–4.500)

## 2016-05-16 LAB — HEPATIC FUNCTION PANEL
ALBUMIN: 4.3 g/dL (ref 3.5–5.0)
ALK PHOS: 59 U/L (ref 38–126)
ALT: 20 U/L (ref 17–63)
AST: 20 U/L (ref 15–41)
BILIRUBIN TOTAL: 2.4 mg/dL — AB (ref 0.3–1.2)
Bilirubin, Direct: 0.2 mg/dL (ref 0.1–0.5)
Indirect Bilirubin: 2.2 mg/dL — ABNORMAL HIGH (ref 0.3–0.9)
TOTAL PROTEIN: 7.6 g/dL (ref 6.5–8.1)

## 2016-05-16 LAB — TROPONIN I: Troponin I: <0.03 ng/mL

## 2016-05-16 LAB — LIPASE, BLOOD: Lipase: 21 U/L (ref 11–51)

## 2016-05-16 LAB — MAGNESIUM: Magnesium: 1.6 mg/dL — ABNORMAL LOW (ref 1.7–2.4)

## 2016-05-16 MED ORDER — ACETAMINOPHEN 650 MG RE SUPP: 650.0000 mg | Freq: Four times a day (QID) | RECTAL | Status: DC | PRN

## 2016-05-16 MED ORDER — BISACODYL 10 MG RE SUPP: 10.0000 mg | Freq: Every day | RECTAL | Status: DC | PRN

## 2016-05-16 MED ORDER — SODIUM CHLORIDE 0.9 % IV BOLUS (SEPSIS)
500.0000 mL | INTRAVENOUS | Status: AC
  Administered 2016-05-16: 500 mL via INTRAVENOUS

## 2016-05-16 MED ORDER — ONDANSETRON HCL 4 MG/2ML IJ SOLN: 4.0000 mg | Freq: Four times a day (QID) | INTRAMUSCULAR | Status: DC | PRN

## 2016-05-16 MED ORDER — AMLODIPINE BESYLATE 5 MG PO TABS: 5.0000 mg | ORAL_TABLET | Freq: Every day | ORAL | Status: DC

## 2016-05-16 MED ORDER — CYCLOSPORINE MODIFIED (GENGRAF) 25 MG PO CAPS
75.0000 mg | ORAL_CAPSULE | Freq: Two times a day (BID) | ORAL | Status: DC
  Administered 2016-05-16 – 2016-05-17 (×2): 75 mg via ORAL

## 2016-05-16 MED ORDER — NON FORMULARY: 75.0000 mg | Freq: Two times a day (BID) | Status: DC

## 2016-05-16 MED ORDER — ONDANSETRON HCL 4 MG PO TABS: 4.0000 mg | ORAL_TABLET | Freq: Four times a day (QID) | ORAL | Status: DC | PRN

## 2016-05-16 MED ORDER — DOCUSATE SODIUM 100 MG PO CAPS
100.0000 mg | ORAL_CAPSULE | Freq: Two times a day (BID) | ORAL | Status: DC
  Administered 2016-05-16: 100 mg via ORAL
  Filled 2016-05-16 (×2): qty 1

## 2016-05-16 MED ORDER — SODIUM CHLORIDE 0.9 % IV SOLN
1.0000 g | Freq: Once | INTRAVENOUS | Status: AC
  Administered 2016-05-16: 1 g via INTRAVENOUS

## 2016-05-16 MED ORDER — SODIUM BICARBONATE 8.4 % IV SOLN
50.0000 meq | Freq: Once | INTRAVENOUS | Status: AC
  Administered 2016-05-16: 50 meq via INTRAVENOUS
  Filled 2016-05-16: qty 50

## 2016-05-16 MED ORDER — PRAZOSIN HCL 5 MG PO CAPS
5.0000 mg | ORAL_CAPSULE | Freq: Every day | ORAL | Status: DC
  Filled 2016-05-16 (×2): qty 1

## 2016-05-16 MED ORDER — ASPIRIN EC 81 MG PO TBEC
81.0000 mg | DELAYED_RELEASE_TABLET | Freq: Every day | ORAL | Status: DC
  Filled 2016-05-16: qty 1

## 2016-05-16 MED ORDER — INSULIN ASPART 100 UNIT/ML ~~LOC~~ SOLN
5.0000 [IU] | Freq: Once | SUBCUTANEOUS | Status: AC
  Administered 2016-05-16: 5 [IU] via INTRAVENOUS
  Filled 2016-05-16: qty 5

## 2016-05-16 MED ORDER — OXYCODONE HCL 5 MG PO TABS: 5.0000 mg | ORAL_TABLET | ORAL | Status: DC | PRN

## 2016-05-16 MED ORDER — DEXTROSE 50 % IV SOLN
1.0000 | Freq: Once | INTRAVENOUS | Status: AC
  Administered 2016-05-16: 50 mL via INTRAVENOUS
  Filled 2016-05-16: qty 50

## 2016-05-16 MED ORDER — SODIUM CHLORIDE 0.9% FLUSH
3.0000 mL | Freq: Two times a day (BID) | INTRAVENOUS | Status: DC
  Administered 2016-05-17: 3 mL via INTRAVENOUS

## 2016-05-16 MED ORDER — MAGNESIUM SULFATE 2 GM/50ML IV SOLN
2.0000 g | Freq: Once | INTRAVENOUS | Status: AC
  Administered 2016-05-16: 2 g via INTRAVENOUS
  Filled 2016-05-16: qty 50

## 2016-05-16 MED ORDER — CYCLOSPORINE 25 MG PO CAPS
75.0000 mg | ORAL_CAPSULE | Freq: Two times a day (BID) | ORAL | Status: DC
  Filled 2016-05-16: qty 3

## 2016-05-16 MED ORDER — ALBUTEROL SULFATE (2.5 MG/3ML) 0.083% IN NEBU: 5.0000 mg | INHALATION_SOLUTION | RESPIRATORY_TRACT | Status: DC

## 2016-05-16 MED ORDER — ACETAMINOPHEN 325 MG PO TABS: 650.0000 mg | ORAL_TABLET | Freq: Four times a day (QID) | ORAL | Status: DC | PRN

## 2016-05-16 MED ORDER — METOPROLOL TARTRATE 25 MG PO TABS
25.0000 mg | ORAL_TABLET | Freq: Four times a day (QID) | ORAL | Status: DC
  Administered 2016-05-16: 25 mg via ORAL
  Filled 2016-05-16: qty 1

## 2016-05-16 MED ORDER — INSULIN ASPART 100 UNIT/ML ~~LOC~~ SOLN: 0.0000 [IU] | Freq: Three times a day (TID) | SUBCUTANEOUS | Status: DC

## 2016-05-16 MED ORDER — INSULIN DETEMIR 100 UNIT/ML ~~LOC~~ SOLN
20.0000 [IU] | Freq: Every day | SUBCUTANEOUS | Status: DC
  Administered 2016-05-17: 20 [IU] via SUBCUTANEOUS
  Filled 2016-05-16: qty 0.2

## 2016-05-16 MED ORDER — MYCOPHENOLATE MOFETIL 250 MG PO CAPS
250.0000 mg | ORAL_CAPSULE | Freq: Two times a day (BID) | ORAL | Status: DC
  Administered 2016-05-16 – 2016-05-17 (×2): 250 mg via ORAL
  Filled 2016-05-16 (×3): qty 1

## 2016-05-16 MED ORDER — AMLODIPINE BESYLATE 5 MG PO TABS
5.0000 mg | ORAL_TABLET | Freq: Every evening | ORAL | Status: DC
  Administered 2016-05-16: 5 mg via ORAL
  Filled 2016-05-16: qty 1

## 2016-05-16 MED ORDER — VENLAFAXINE HCL 37.5 MG PO TABS
37.5000 mg | ORAL_TABLET | Freq: Every day | ORAL | Status: DC
  Administered 2016-05-17: 37.5 mg via ORAL
  Filled 2016-05-16: qty 1

## 2016-05-16 MED ORDER — SODIUM CHLORIDE 0.9 % IV SOLN
INTRAVENOUS | Status: DC
  Administered 2016-05-16 – 2016-05-17 (×2): via INTRAVENOUS

## 2016-05-16 MED ORDER — HEPARIN SODIUM (PORCINE) 5000 UNIT/ML IJ SOLN
5000.0000 [IU] | Freq: Three times a day (TID) | INTRAMUSCULAR | Status: DC
  Filled 2016-05-16: qty 1

## 2016-05-16 MED ORDER — CALCIUM GLUCONATE 10 % IV SOLN
INTRAVENOUS | Status: AC
  Administered 2016-05-16: 1000 mg
  Filled 2016-05-16: qty 10

## 2016-05-16 MED ORDER — PREDNISONE 2.5 MG PO TABS
5.0000 mg | ORAL_TABLET | Freq: Every day | ORAL | Status: DC
  Administered 2016-05-17: 5 mg via ORAL
  Filled 2016-05-16: qty 2

## 2016-05-16 NOTE — Progress Notes (Signed)
Patient stated that he cannot take generic cyclosporine as it is ineffective for him and has to take Gengraf instead. Pharmacy does npt carry this medication, so order received from MD Willis to allow patient to take home medication. Patient only has enough for 2 doses and his daughter said she will bring more from home tomorrow. Per pharmacy, okay to give PM dose now and keep next dose in patient's medication bin to give tomorrow. Earleen Reaper, RN

## 2016-05-16 NOTE — ED Provider Notes (Signed)
[facility] Emergency Department Provider Note  ____________________________________________   First MD Initiated Contact with Patient 05/16/16 1745     (approximate)  I have reviewed the triage vital signs and the nursing notes.   HISTORY  Chief Complaint Chest Pain    HPI [name] is a 75 y.o. male with a history of liver transplant performed at [facility] as well as paroxysmal atrial fibrillation who presents for evaluation of persistent palpitations and rapid heart rate today as well as some mild chest discomfort.  He states that normally he has these episodes of waxes much of fibrillation which are typically brief and controlled with metoprolol.  He states that today, however, he awoke with the symptoms during the middle of the night and they have been persistent all day today.  He states that he took 2 doses of his metoprolol after calling in discussing it with the on-call cardiologist who is covering for Dr. Nehemiah Massed and was told to come into the emergency department if he still feels symptoms after taking 2 doses of metoprolol.  He did as instructed and Still feels the palpitations.  His heart rate has not gone over 105.  He denies shortness of breath.  He has had brief episodes of nausea.  He denies abdominal pain, dysuria, fever/chills.   Past Medical History:  Diagnosis Date  . Atrial fibrillation ([place])   . Cirrhosis of liver ([place])   . Diabetes mellitus without complication ([place])   . Dysrhythmia   . Liver disease     Patient Active Problem List   Diagnosis Date Noted  . Hyperkalemia 05/16/2016  . Abnormal EKG 05/16/2016  . PAF (paroxysmal atrial fibrillation) ([place]) 05/16/2016  . Chest pain 05/16/2016    Past Surgical History:  Procedure Laterality Date  . HERNIA REPAIR    . Liver Transplant Surgery      Prior to Admission medications   Medication Sig Start Date End Date Taking? Authorizing Provider  amLODipine (NORVASC) 5 MG tablet  Take 1 tablet by mouth daily. 05/14/16  Yes Historical Provider, MD  Cyanocobalamin (VITAMIN B-12 PO) Take 1 tablet by mouth daily.   Yes Historical Provider, MD  cycloSPORINE (SANDIMMUNE) 100 MG capsule Take 75 mg by mouth 2 (two) times daily.   Yes Historical Provider, MD  MAGNESIUM OXIDE PO Take 1 tablet by mouth 2 (two) times daily. Take 2 tablets at 1300 and 1 tablet at 1600   Yes Historical Provider, MD  metoprolol tartrate (LOPRESSOR) 25 MG tablet Take 25 mg by mouth 2 (two) times daily.   Yes Historical Provider, MD  Multiple Vitamins-Minerals (MULTIVITAMIN ADULT) CHEW Chew 1 tablet by mouth daily.   Yes Historical Provider, MD  mycophenolate (CELLCEPT) 250 MG capsule Take 250 mg by mouth 2 (two) times daily.   Yes Historical Provider, MD  omeprazole (PRILOSEC) 20 MG capsule Take 20 mg by mouth daily.   Yes Historical Provider, MD  predniSONE (DELTASONE) 5 MG tablet Take 5 mg by mouth daily with breakfast.   Yes Historical Provider, MD  venlafaxine (EFFEXOR) 37.5 MG tablet Take 75 mg by mouth daily.    Yes Historical Provider, MD    Allergies Patient has no known allergies.  History reviewed. No pertinent family history.  Social History Social History  Substance Use Topics  . Smoking status: Never Smoker  . Smokeless tobacco: Never Used  . Alcohol use No    Review of Systems Constitutional: No fever/chills Eyes: No visual changes. ENT: No sore throat.  Cardiovascular: Chest discomfort with palpitations and rapid rate Respiratory: Denies shortness of breath. Gastrointestinal: No abdominal pain.  No nausea, no vomiting.  No diarrhea.  No constipation. Genitourinary: Negative for dysuria. Musculoskeletal: Negative for back pain. Skin: Negative for rash. Neurological: Negative for headaches, focal weakness or numbness.  10-point ROS otherwise negative.  ____________________________________________   PHYSICAL EXAM:  VITAL SIGNS: ED Triage Vitals [05/16/16 1601]  Enc  Vitals Group     BP 118/81     Pulse Rate 66     Resp 20     Temp 98.5 F (36.9 C)     Temp Source Oral     SpO2 97 %     Weight 220 lb (99.8 kg)     Height 5\' 8"  (1.727 m)     Head Circumference      Peak Flow      Pain Score 3     Pain Loc      Pain Edu?      Excl. in [place]?     Constitutional: Alert and oriented. Well appearing and in no acute distress. Eyes: Conjunctivae are normal. PERRL. EOMI. Head: Atraumatic. Nose: No congestion/rhinnorhea. Mouth/Throat: Mucous membranes are moist.  Oropharynx non-erythematous. Neck: No stridor.  No meningeal signs.   Cardiovascular: Irregularly irregular and normal rate. Good peripheral circulation. Grossly normal heart sounds. Respiratory: Normal respiratory effort.  No retractions. Lungs CTAB. Gastrointestinal: Soft and nontender. No distention.  Musculoskeletal: No lower extremity tenderness nor edema. No gross deformities of extremities. Neurologic:  Normal speech and language. No gross focal neurologic deficits are appreciated.  Skin:  Skin is warm, dry and intact. No rash noted. Psychiatric: Mood and affect are normal. Speech and behavior are normal.  ____________________________________________   LABS (all labs ordered are listed, but only abnormal results are displayed)  Labs Reviewed  BASIC METABOLIC PANEL - Abnormal; Notable for the following:       Result Value   Potassium 5.9 (*)    CO2 21 (*)    Glucose, Bld 211 (*)    BUN 49 (*)    Creatinine, Ser 1.37 (*)    GFR calc non Af Amer 49 (*)    GFR calc Af Amer 57 (*)    All other components within normal limits  MAGNESIUM - Abnormal; Notable for the following:    Magnesium 1.6 (*)    All other components within normal limits  HEPATIC FUNCTION PANEL - Abnormal; Notable for the following:    Total Bilirubin 2.4 (*)    Indirect Bilirubin 2.2 (*)    All other components within normal limits  GLUCOSE, CAPILLARY - Abnormal; Notable for the following:     Glucose-Capillary 161 (*)    All other components within normal limits  CBC  TROPONIN I  LIPASE, BLOOD  TSH  RENAL FUNCTION PANEL  COMPREHENSIVE METABOLIC PANEL  CBC  TROPONIN I  TROPONIN I   ____________________________________________  EKG  ED ECG REPORT I, Romar Woodrick, the attending physician, personally viewed and interpreted this ECG.  Date: 05/16/2016 EKG Time: 15:47 Rate: 94 Rhythm: Atrial fibrillation QRS Axis: Left axis deviation Intervals: Atrial fibrillation, otherwise unremarkable ST/T Wave abnormalities: Peaked T waves most notable in leads V2, V3, V4, V5 Conduction Disturbances: none Narrative Interpretation: Concerning for peaked T waves in the setting of hyperkalemia  ____________________________________________  RADIOLOGY   Dg Chest 2 View  Result Date: 05/16/2016 CLINICAL DATA:  Dx with AFIB about a year ago, one episode last week, another today. Dizzy,  light SOB. Was just put on BP meds last week for the first time. Non smoker. EXAM: CHEST  2 VIEW COMPARISON:  01/27/2014 FINDINGS: There is focal opacity projects along the posterior right seventh rib. This could be callus formation and rib from a prior fracture. It could reflect a superimposed lung nodule. This is not defined on the lateral view. Lungs are otherwise clear.  No pleural effusion.  No pneumothorax. Cardiac silhouette is normal in size. No mediastinal or hilar masses or evidence of adenopathy. Skeletal structures are demineralized. IMPRESSION: 1. No acute cardiopulmonary disease. 2. Possible right lung nodule. Recommend follow-up chest CT for further assessment. Electronically Signed   By: Lajean Manes M.D.   On: 05/16/2016 16:49    ____________________________________________   PROCEDURES  Procedure(s) performed:   .Critical Care Performed by: Hinda Kehr Authorized by: Hinda Kehr   Critical care provider statement:    Critical care time (minutes):  30   Critical care time  was exclusive of:  Separately billable procedures and treating other patients   Critical care was necessary to treat or prevent imminent or life-threatening deterioration of the following conditions:  Metabolic crisis   Critical care was time spent personally by me on the following activities:  Development of treatment plan with patient or surrogate, discussions with consultants, evaluation of patient's response to treatment, examination of patient, obtaining history from patient or surrogate, ordering and performing treatments and interventions, ordering and review of laboratory studies, ordering and review of radiographic studies, pulse oximetry, re-evaluation of patient's condition and review of old charts     Critical Care performed: No ____________________________________________   INITIAL IMPRESSION / ASSESSMENT AND PLAN / ED COURSE  Pertinent labs & imaging results that were available during my care of the patient were reviewed by me and considered in my medical decision making (see chart for details).  The patient is not in significant distress and even though he states his normal heart rate is in the 50s and 60s, his current heart rate of around 100 seem unusual, he does not have significant tachycardia.  His EKG does indicate that he is still in atrial fibrillation and possibly more concern is his potassium of 5.9.  I discussed this with him and his daughter and they told me that he has had a history of hyperkalemia although not recently.  He states that they think it might be a side effect of the cyclosporine but they are not sure.  Given that he has a history of hyperkalemia and peaked T waves on the EKG as well as atrial fibrillation, chest discomfort, palpitations, I will begin medication to stabilize his myocardium and at least temporarily reduce his potassium: Calcium gluconate 1 g IV, sodium bicarbonate 1 amp IV,  regular insulin 5 units IV, D-50, one amp IV. Holding off on  albuterol because I do not want to worsen tachycardia.  Also giving 500 mL NS bolus.  Holding off on Kayexalate given Emergency medicine efficacy and cup location reports on this particular medication.  He has no signs or symptoms of infection even with him being immunosuppressed.  I will call the liver transplant coordinator at [facility] to discuss whether or not they feel that he would need transfer to [facility] or whether we can treat him as any other hyperkalemic patient at this hospital.  The patient is stable at this time.   (Note that documentation was delayed due to multiple ED patients requiring immediate care.)   I called and spoke with  the liver transplant coordinator at [facility].  She had already been informed by the patient's daughter that he was here and she had spoken with Dr. Damita Lack the hepatologist.  They agreed that they would be happy to take the patient as a transfer if that is the patient requests, but they do not feel it is absolutely necessary that the patient be transferred and that we can treat him like any other acutely anemia/atrial fibrillation patient in spite of his history of liver transplant.  I discussed this with the patient and he prefers to stay local which I think is perfectly acceptable.  I discussed the case with the hospitalist who will admit.  ____________________________________________  FINAL CLINICAL IMPRESSION(S) / ED DIAGNOSES  Final diagnoses:  Hyperkalemia  Chest pain, unspecified type  Paroxysmal atrial fibrillation ([place])  S/P liver transplant ([place])  Immunosuppressed status ([place])     MEDICATIONS GIVEN DURING THIS VISIT:  Medications  0.9 %  sodium chloride infusion ( Intravenous New Bag/Given 05/16/16 2020)  insulin aspart (novoLOG) injection 0-9 Units (not administered)  amLODipine (NORVASC) tablet 5 mg (not administered)  cycloSPORINE (SANDIMMUNE) capsule 75 mg (not administered)  mycophenolate (CELLCEPT) capsule 250 mg (not administered)  prazosin  (MINIPRESS) capsule 5 mg (not administered)  predniSONE (DELTASONE) tablet 5 mg (not administered)  venlafaxine (EFFEXOR) tablet 37.5 mg (not administered)  metoprolol tartrate (LOPRESSOR) tablet 25 mg (25 mg Oral Not Given 05/16/16 2030)  heparin injection 5,000 Units (not administered)  sodium chloride flush (NS) 0.9 % injection 3 mL (not administered)  acetaminophen (TYLENOL) tablet 650 mg (not administered)    Or  acetaminophen (TYLENOL) suppository 650 mg (not administered)  oxyCODONE (Oxy IR/ROXICODONE) immediate release tablet 5 mg (not administered)  docusate sodium (COLACE) capsule 100 mg (not administered)  bisacodyl (DULCOLAX) suppository 10 mg (not administered)  ondansetron (ZOFRAN) tablet 4 mg (not administered)    Or  ondansetron (ZOFRAN) injection 4 mg (not administered)  aspirin EC tablet 81 mg (not administered)  insulin detemir (LEVEMIR) injection 20 Units (20 Units Subcutaneous Not Given 05/16/16 2030)  calcium gluconate 1 g in sodium chloride 0.9 % 100 mL IVPB (0 g Intravenous Stopped 05/16/16 1843)  insulin aspart (novoLOG) injection 5 Units (5 Units Intravenous Given 05/16/16 1833)  dextrose 50 % solution 50 mL (50 mLs Intravenous Given 05/16/16 1833)  sodium bicarbonate injection 50 mEq (50 mEq Intravenous Given 05/16/16 1833)  sodium chloride 0.9 % bolus 500 mL (0 mLs Intravenous Stopped 05/16/16 1929)  calcium gluconate 10 % injection (1,000 mg  Given 05/16/16 1846)  magnesium sulfate IVPB 2 g 50 mL (0 g Intravenous Stopped 05/16/16 2015)     NEW OUTPATIENT MEDICATIONS STARTED DURING THIS VISIT:  Current Discharge Medication List      Current Discharge Medication List      Current Discharge Medication List    STOP taking these medications     prazosin (MINIPRESS) 5 MG capsule Comments:  Reason for Stopping:           Note:  This document was prepared using Dragon voice recognition software and may include unintentional dictation errors.    Hinda Kehr, MD 05/16/16 2122

## 2016-05-16 NOTE — H&P (Signed)
History and Physical    [name] DOB: January 27, 1941 DOA: 05/16/2016  Referring physician: Dr. Karma Greaser PCP: Gordon Pitch, MD  Specialists: Dr. Nehemiah Massed  Chief Complaint: palpitations with near syncope and CP  HPI: [name] is a 75 y.o. male has a past medical history significant for liver transplant, DM, and PAF now with prolonged A-fib despite extra doses of lopressor. Noted palpitations and CP radiating to the neck with near syncope. Came to ER where he was noted to be in A-fib. EKG showed peaked T-waves and K+ was elevated at 5.9. He is now admitted. Still with palpitations but denies CP or near syncope at present.  Review of Systems: The patient denies anorexia, fever, weight loss,, vision loss, decreased hearing, hoarseness,  syncope, dyspnea on exertion, peripheral edema, balance deficits, hemoptysis, abdominal pain, melena, hematochezia, severe indigestion/heartburn, hematuria, incontinence, genital sores, muscle weakness, suspicious skin lesions, transient blindness, difficulty walking, depression, unusual weight change, abnormal bleeding, enlarged lymph nodes, angioedema, and breast masses.   Past Medical History:  Diagnosis Date  . Atrial fibrillation ([place])   . Cirrhosis of liver ([place])   . Diabetes mellitus without complication ([place])   . Dysrhythmia   . Liver disease    Past Surgical History:  Procedure Laterality Date  . HERNIA REPAIR    . Liver Transplant Surgery     Social History:  reports that he has never smoked. He has never used smokeless tobacco. He reports that he does not drink alcohol. His drug history is not on file.  No Known Allergies  History reviewed. No pertinent family history.  Prior to Admission medications   Medication Sig Start Date End Date Taking? Authorizing Provider  amLODipine (NORVASC) 5 MG tablet Take 1 tablet by mouth daily. 05/14/16   Historical Provider, MD  Calcium Citrate-Vitamin D (CALCIUM + D PO) Take by  mouth.    Historical Provider, MD  cycloSPORINE (SANDIMMUNE) 100 MG capsule Take 75 mg by mouth 2 (two) times daily.    Historical Provider, MD  MAGNESIUM OXIDE PO Take by mouth.    Historical Provider, MD  mycophenolate (CELLCEPT) 250 MG capsule Take 250 mg by mouth 2 (two) times daily.    Historical Provider, MD  prazosin (MINIPRESS) 5 MG capsule Take 5 mg by mouth at bedtime.    Historical Provider, MD  predniSONE (DELTASONE) 5 MG tablet Take 5 mg by mouth daily with breakfast.    Historical Provider, MD  venlafaxine (EFFEXOR) 37.5 MG tablet Take 37.5 mg by mouth daily.    Historical Provider, MD   Physical Exam: Vitals:   05/16/16 1730 05/16/16 1800 05/16/16 1845 05/16/16 1849  BP: 110/82 110/81  123/80  Pulse: 78 (!) 49 69 82  Resp: 19 17 14  (!) 28  Temp:      TempSrc:      SpO2: 97% 98% 98% 98%  Weight:      Height:         General:  No apparent distress, WDWN, [place]/AT  Eyes: PERRL, EOMI, no scleral icterus, conjunctiva clear  ENT: moist oropharynx without exudate, TM's benign, dentition good  Neck: supple, no lymphadenopathy. No bruits or thyromegaly  Cardiovascular: irregularly irregular without MRG; 2+ peripheral pulses, no JVD, no peripheral edema  Respiratory: CTA biL, good air movement without wheezing, rhonchi or crackled.Marland Kitchen Respiratory effort normal  Abdomen: soft, non tender to palpation, positive bowel sounds, no guarding, no rebound  Skin: no rashes or lesions  Musculoskeletal: normal bulk and tone, no joint swelling  Psychiatric: normal mood and affect, A&OX3  Neurologic: CN 2-12 grossly intact, Motor strength 5/5 in all 4 groups with symmetric DTR's and non-focal sensory exam  Labs on Admission:  Basic Metabolic Panel:  Recent Labs Lab 05/16/16 1548  NA 136  K 5.9*  CL 108  CO2 21*  GLUCOSE 211*  BUN 49*  CREATININE 1.37*  CALCIUM 9.2  MG 1.6*   Liver Function Tests:  Recent Labs Lab 05/16/16 1548  AST 20  ALT 20  ALKPHOS 59  BILITOT  2.4*  PROT 7.6  ALBUMIN 4.3    Recent Labs Lab 05/16/16 1548  LIPASE 21   No results for input(s): AMMONIA in the last 168 hours. CBC:  Recent Labs Lab 05/16/16 1548  WBC 7.1  HGB 15.1  HCT 43.7  MCV 93.3  PLT 166   Cardiac Enzymes:  Recent Labs Lab 05/16/16 1548  TROPONINI <0.03    BNP (last 3 results) No results for input(s): BNP in the last 8760 hours.  ProBNP (last 3 results) No results for input(s): PROBNP in the last 8760 hours.  CBG: No results for input(s): GLUCAP in the last 168 hours.  Radiological Exams on Admission: Dg Chest 2 View  Result Date: 05/16/2016 CLINICAL DATA:  Dx with AFIB about a year ago, one episode last week, another today. Dizzy, light SOB. Was just put on BP meds last week for the first time. Non smoker. EXAM: CHEST  2 VIEW COMPARISON:  01/27/2014 FINDINGS: There is focal opacity projects along the posterior right seventh rib. This could be callus formation and rib from a prior fracture. It could reflect a superimposed lung nodule. This is not defined on the lateral view. Lungs are otherwise clear.  No pleural effusion.  No pneumothorax. Cardiac silhouette is normal in size. No mediastinal or hilar masses or evidence of adenopathy. Skeletal structures are demineralized. IMPRESSION: 1. No acute cardiopulmonary disease. 2. Possible right lung nodule. Recommend follow-up chest CT for further assessment. Electronically Signed   By: Lajean Manes M.D.   On: 05/16/2016 16:49    EKG: Independently reviewed.  Assessment/Plan Principal Problem:   PAF (paroxysmal atrial fibrillation) (HCC) Active Problems:   Hyperkalemia   Abnormal EKG   Chest pain   Will observe on telemetry. Treatment initiated for hyperkalemia in ER. Will repeat renal panel in 3-4 hours. Follow enzymes, order echo. Consult Cardiology. Continue outpatient meds for now.  Diet: clear liquids Fluids: NS@100  DVT Prophylaxis: SQ Heparin   Code Status: FULL  Family  Communication: yes  Disposition Plan: home  Time spent: home

## 2016-05-16 NOTE — ED Notes (Signed)
Report called to floor nurse kara rn.

## 2016-05-16 NOTE — ED Notes (Signed)
Iv meds infused.  Dr Karma Greaser and dr sparks in with pt and family.  Pt alert.  No pain now.  Skin warm and dry.  afib on monitor.

## 2016-05-16 NOTE — Progress Notes (Signed)
Patient arrived to 2A Room 245. Patient denies pain and all questions answered. Patient oriented to unit and Fall Safety Plan signed. Skin assessment completed with Yasmin RN and skin intact. A&Ox4, VSS, and A-fib on verified tele-box #[id number]. Daughter at bedside and will be taking patient's meds home. Nursing staff will continue to monitor for any changes in patient status. Earleen [place], RN

## 2016-05-16 NOTE — ED Triage Notes (Signed)
Pt started with central chest pain this morning. Denies SHOB. Hx afib. NAD at this time.  Color WNL.

## 2016-05-16 NOTE — ED Notes (Signed)
Pt reports he has intermittent chest pain today.  Hx afib.  No sob.  Non radiating pain in chest .  No n/v/d.   Pt had liver transplant at [facility].  Pt reportedly started new bp med 2 days ago.  Treated by dr Nehemiah Massed.  Pt alert.  Family with pt.

## 2016-05-17 DIAGNOSIS — I480 Paroxysmal atrial fibrillation: Secondary | ICD-10-CM | POA: Diagnosis not present

## 2016-05-17 LAB — COMPREHENSIVE METABOLIC PANEL
ALBUMIN: 3.6 g/dL (ref 3.5–5.0)
ALK PHOS: 45 U/L (ref 38–126)
ALT: 17 U/L (ref 17–63)
AST: 15 U/L (ref 15–41)
Anion gap: 4 — ABNORMAL LOW (ref 5–15)
BILIRUBIN TOTAL: 2 mg/dL — AB (ref 0.3–1.2)
BUN: 46 mg/dL — AB (ref 6–20)
CO2: 22 mmol/L (ref 22–32)
Calcium: 8.5 mg/dL — ABNORMAL LOW (ref 8.9–10.3)
Chloride: 111 mmol/L (ref 101–111)
Creatinine, Ser: 1.23 mg/dL (ref 0.61–1.24)
GFR calc Af Amer: >60 mL/min (ref >60)
GFR calc non Af Amer: 56 mL/min — ABNORMAL LOW (ref >60)
GLUCOSE: 113 mg/dL — AB (ref 65–99)
POTASSIUM: 4.7 mmol/L (ref 3.5–5.1)
Sodium: 137 mmol/L (ref 135–145)
TOTAL PROTEIN: 6.1 g/dL — AB (ref 6.5–8.1)

## 2016-05-17 LAB — CBC
HCT: 37.5 % — ABNORMAL LOW (ref 40.0–52.0)
Hemoglobin: 13.2 g/dL (ref 13.0–18.0)
MCH: 32.6 pg (ref 26.0–34.0)
MCHC: 35.3 g/dL (ref 32.0–36.0)
MCV: 92.3 fL (ref 80.0–100.0)
Platelets: 134 10*3/uL — ABNORMAL LOW (ref 150–440)
RBC: 4.06 MIL/uL — ABNORMAL LOW (ref 4.40–5.90)
RDW: 13.5 % (ref 11.5–14.5)
WBC: 5.8 10*3/uL (ref 3.8–10.6)

## 2016-05-17 LAB — GLUCOSE, CAPILLARY
Glucose-Capillary: 109 mg/dL — ABNORMAL HIGH (ref 65–99)
Glucose-Capillary: 148 mg/dL — ABNORMAL HIGH (ref 65–99)

## 2016-05-17 LAB — TROPONIN I: Troponin I: <0.03 ng/mL (ref <0.03)

## 2016-05-17 MED ORDER — METOPROLOL TARTRATE 25 MG PO TABS: 25.0000 mg | ORAL_TABLET | Freq: Two times a day (BID) | ORAL | Status: DC

## 2016-05-17 NOTE — Consult Note (Signed)
[name]  CARDIOLOGY CONSULT NOTE  Patient ID: [name] MRN: [id number] DOB/AGE: 1940-12-13 75 y.o.  Admit date: 05/16/2016 Referring Physician Dr. Bridgett Lee Primary Physician   Primary Cardiologist Dr. Nehemiah Lee Reason for Consultation afib  HPI: the patient is a 75 year old male with history of paroxysmal atrial fibrillation as well as a liver transplant who also has diabetes mellitus. He was admitted after developing recurrent atrial fibrillation. He takes metoprolol tartrate 25 bid routinely and take extra for [place]. He had recurrent afib yesterday which did not resond to extra metoprolol. He presented to the er where he was in afib with controlled vr. He was noted to have serum K of 5.9 and Mg of 1.6 with acute on chronic renal insuffiency with creatinine of 1.43 with baseline of 1.2. He was treated with iv mg and remained on metoprolol. Currently in sinus bradycardia. No evidence of ischemia with troponin of 0.03.  Review of Systems  Constitutional: Negative.   HENT: Negative.   Eyes: Negative.   Respiratory: Negative.   Cardiovascular: Positive for palpitations.  Gastrointestinal: Negative.   Genitourinary: Negative.   Musculoskeletal: Negative.   Skin: Negative.   Neurological: Negative.   Endo/Heme/Allergies: Negative.   Psychiatric/Behavioral: Negative.     Past Medical History:  Diagnosis Date  . Atrial fibrillation ([place])   . Cirrhosis of liver ([place])   . Diabetes mellitus without complication ([place])   . Dysrhythmia   . Liver disease     History reviewed. No pertinent family history.  Social History   Social History  . Marital status: Widowed    Spouse name: N/A  . Number of children: N/A  . Years of education: N/A   Occupational History  . Not on file.   Social History Main Topics  . Smoking status: Never Smoker  . Smokeless tobacco: Never Used  . Alcohol use No  . Drug use: Unknown  . Sexual activity:  Not on file   Other Topics Concern  . Not on file   Social History Narrative  . No narrative on file    Past Surgical History:  Procedure Laterality Date  . HERNIA REPAIR    . Liver Transplant Surgery       Prescriptions Prior to Admission  Medication Sig Dispense Refill Last Dose  . amLODipine (NORVASC) 5 MG tablet Take 1 tablet by mouth daily.   05/15/2016 at Unknown time  . Cyanocobalamin (VITAMIN B-12 PO) Take 1 tablet by mouth daily.     . cycloSPORINE (SANDIMMUNE) 100 MG capsule Take 75 mg by mouth 2 (two) times daily.   05/16/2016 at 0930  . MAGNESIUM OXIDE PO Take 1 tablet by mouth 2 (two) times daily. Take 2 tablets at 1300 and 1 tablet at 1600   05/16/2016 at Unknown time  . metoprolol tartrate (LOPRESSOR) 25 MG tablet Take 25 mg by mouth 2 (two) times daily.   05/16/2016 at 0930  . Multiple Vitamins-Minerals (MULTIVITAMIN ADULT) CHEW Chew 1 tablet by mouth daily.   05/16/2016 at 0930  . mycophenolate (CELLCEPT) 250 MG capsule Take 250 mg by mouth 2 (two) times daily.   05/16/2016 at 0930  . omeprazole (PRILOSEC) 20 MG capsule Take 20 mg by mouth daily.   05/16/2016 at 0930  . predniSONE (DELTASONE) 5 MG tablet Take 5 mg by mouth daily with breakfast.   05/16/2016 at 0930  . venlafaxine (EFFEXOR) 37.5 MG tablet Take 75 mg by mouth daily.    05/16/2016 at 0930  Physical Exam: Blood pressure 123/71, pulse (!) 56, temperature 97.5 F (36.4 C), temperature source Oral, resp. rate 16, height 5\' 8"  (1.727 m), weight 224 lb 11.2 oz (101.9 kg), SpO2 100 %.   Wt Readings from Last 1 Encounters:  05/17/16 224 lb 11.2 oz (101.9 kg)     General appearance: alert and cooperative Resp: clear to auscultation bilaterally Cardio: regular rate and rhythm GI: soft, non-tender; bowel sounds normal; no masses,  no organomegaly Extremities: extremities normal, atraumatic, no cyanosis or edema Neurologic: Grossly normal  Labs:   Lab Results  Component Value Date   WBC 5.8 05/17/2016   HGB  13.2 05/17/2016   HCT 37.5 (L) 05/17/2016   MCV 92.3 05/17/2016   PLT 134 (L) 05/17/2016    Recent Labs Lab 05/17/16 0424  NA 137  K 4.7  CL 111  CO2 22  BUN 46*  CREATININE 1.23  CALCIUM 8.5*  PROT 6.1*  BILITOT 2.0*  ALKPHOS 45  ALT 17  AST 15  GLUCOSE 113*   Lab Results  Component Value Date   TROPONINI <0.03 05/17/2016      Radiology: No acute cardiopulmonary disease EKG: afib with controlled vr on admission with sinus bradycardia at present.  ASSESSMENT AND PLAN:  Pt with hx of liver transplant with paroxysmal afib with chads score of 2. Not a candidate for chronic anticoagulation due to liver transplant. Had breakthrough afib which is likely secondary to electolyte abnormalities. Would defer asa or any other antiplatelet or anticoagulation therapy. Continue with metoprolol tartrate 25 mg bid with 25 mg for breakthrough. Will cancel echo and due echo as outpatient as scheduled. Ambulate this am and if stable discharge with outpatient follow up.  Signed: Teodoro Spray MD, [facility] 05/17/2016, 9:14 AM

## 2016-05-17 NOTE — Progress Notes (Signed)
Discharged to home. No medication changes.  Reviewed the meds he's taken today, also as a paper list.  Feeling well.

## 2016-05-17 NOTE — Discharge Summary (Signed)
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 Atrial fibrillation ([place])   . Cirrhosis of liver ([place])   . Diabetes mellitus without complication ([place])   . Dysrhythmia   . Liver disease    HOSPITAL COURSE:   PAF (paroxysmal atrial fibrillation), Heart rate is controlled, continue Lopressor 25 mg twice a day, aspirin or anticoagulation, the patient may be discharged to home and follow up with Dr. Nehemiah Massed as outpatient per Dr. Ubaldo Glassing.  Hyperkalemia. Improved. Diabetes. Controlled, on sliding scale. Discussed with Dr. Ubaldo Glassing. DISCHARGE CONDITIONS:  Stable, discharged to home today. CONSULTS OBTAINED:  Treatment Team:  Teodoro Spray, MD DRUG ALLERGIES:  No Known Allergies DISCHARGE MEDICATIONS:     Medication List    TAKE these medications   amLODipine 5 MG tablet Commonly known as:  NORVASC Take 1 tablet by mouth daily.   cycloSPORINE 100 MG capsule Commonly known as:  SANDIMMUNE Take 75 mg by mouth 2 (two) times daily.   MAGNESIUM OXIDE PO Take 1 tablet by mouth 2 (two) times daily. Take 2 tablets at 1300 and 1 tablet at 1600 Notes to patient:  NONE GIVEN TODAY   metoprolol tartrate 25 MG tablet Commonly known as:  LOPRESSOR Take 25 mg by mouth 2 (two) times daily. Notes to patient:  NONE GIVEN TODAY   MULTIVITAMIN ADULT Chew Chew 1 tablet by mouth daily. Notes to patient:  NONE GIVEN TODAY   mycophenolate 250 MG capsule Commonly known as:  CELLCEPT Take 250 mg by mouth 2 (two) times daily.   omeprazole 20 MG capsule Commonly known as:  PRILOSEC Take 20 mg by mouth daily. Notes to patient:  NONE TODAY   predniSONE 5 MG tablet Commonly known as:  DELTASONE Take 5 mg by mouth daily with breakfast.   venlafaxine 37.5 MG tablet Commonly known as:  EFFEXOR Take 75 mg by mouth daily.   VITAMIN B-12 PO Take 1 tablet by mouth daily. Notes to patient:  NONE TODAY        DISCHARGE INSTRUCTIONS:  See AVS.  If you experience worsening of your admission symptoms, develop shortness of breath, life threatening emergency, suicidal or homicidal thoughts you must seek medical attention immediately by calling 911 or calling your MD immediately  if symptoms less severe.  You Must read complete instructions/literature along with all the possible adverse reactions/side effects for all the Medicines you take and that have been prescribed to you. Take any new Medicines after you have completely understood and accpet all the possible adverse reactions/side effects.   Please note  You were cared for by a hospitalist during your hospital stay. If you have any questions about your discharge medications or the care you received while you were in the hospital after you are discharged, you can call the unit and asked to speak with the hospitalist on call if  the hospitalist that took care of you is not available. Once you are discharged, your primary care physician will handle any further medical issues. Please note that NO REFILLS for any discharge medications will be authorized once you are discharged, as it is imperative that you return to your primary care physician (or establish a relationship with a primary care physician if you do not have one) for your aftercare needs so that they can reassess your  need for medications and monitor your lab values.    On the day of Discharge:  VITAL SIGNS:  Blood pressure (!) 143/77, pulse 69, temperature 98.2 F (36.8 C), temperature source Oral, resp. rate 20, height 5\' 8"  (1.727 m), weight 224 lb 11.2 oz (101.9 kg), SpO2 97 %. PHYSICAL EXAMINATION:  GENERAL:  75 y.o.-year-old patient lying in the bed with no acute distress.  EYES: Pupils equal, round, reactive to light and accommodation. No scleral icterus. Extraocular muscles intact.  HEENT: Head atraumatic, normocephalic. Oropharynx and nasopharynx clear.  NECK:  Supple, no jugular venous distention. No thyroid enlargement, no tenderness.  LUNGS: Normal breath sounds bilaterally, no wheezing, rales,rhonchi or crepitation. No use of accessory muscles of respiration.  CARDIOVASCULAR: S1, S2 normal. Systolic murmurs, no rubs, or gallops.  ABDOMEN: Soft, non-tender, non-distended. Bowel sounds present. No organomegaly or mass.  EXTREMITIES: No pedal edema, cyanosis, or clubbing.  NEUROLOGIC: Cranial nerves II through XII are intact. Muscle strength 5/5 in all extremities. Sensation intact. Gait not checked.  PSYCHIATRIC: The patient is alert and oriented x 3.  SKIN: No obvious rash, lesion, or ulcer.  DATA REVIEW:   CBC  Recent Labs Lab 05/17/16 0424  WBC 5.8  HGB 13.2  HCT 37.5*  PLT 134*    Chemistries   Recent Labs Lab 05/16/16 1548  05/17/16 0424  NA 136  < > 137  K 5.9*  < > 4.7  CL 108  < > 111  CO2 21*  < > 22  GLUCOSE 211*  < > 113*  BUN 49*  < > 46*  CREATININE 1.37*  < > 1.23  CALCIUM 9.2  < > 8.5*  MG 1.6*  --   --   AST 20  --  15  ALT 20  --  17  ALKPHOS 59  --  45  BILITOT 2.4*  --  2.0*  < > = values in this interval not displayed.   Microbiology Results  Results for orders placed or performed in visit on 01/27/14  Culture, blood (single)     Status: None   Collection Time: 01/27/14 12:41 PM  Result Value Ref Range Status   Micro Text Report   Final        COMMENT                   NO GROWTH AEROBICALLY/ANAEROBICALLY IN 5 DAYS   ANTIBIOTIC                                                      Culture, blood (single)     Status: None   Collection Time: 01/27/14 12:41 PM  Result Value Ref Range Status   Micro Text Report   Final       COMMENT  NO GROWTH AEROBICALLY/ANAEROBICALLY IN 5 DAYS   ANTIBIOTIC                                                        RADIOLOGY:  Dg Chest 2 View  Result Date: 05/16/2016 CLINICAL DATA:  Dx with AFIB about a year ago, one episode last week, another today. Dizzy, light SOB. Was just put on BP meds last week for the first time. Non smoker. EXAM: CHEST  2 VIEW COMPARISON:  01/27/2014 FINDINGS: There is focal opacity projects along the posterior right seventh rib. This could be callus formation and rib from a prior fracture. It could reflect a superimposed lung nodule. This is not defined on the lateral view. Lungs are otherwise clear.  No pleural effusion.  No pneumothorax. Cardiac silhouette is normal in size. No mediastinal or hilar masses or evidence of adenopathy. Skeletal structures are demineralized. IMPRESSION: 1. No acute cardiopulmonary disease. 2. Possible right lung nodule. Recommend follow-up chest CT for further assessment. Electronically Signed   By: Lajean Manes M.D.   On: 05/16/2016 16:49     Management plans discussed with the patient, family and they are in agreement.  CODE STATUS:     Code Status Orders        Start     Ordered   05/16/16 2020  Full code  Continuous     05/16/16 2019    Code Status History    Date Active Date Inactive Code Status Order ID Comments User Context   This patient has a current code status but no historical code status.    Advance Directive Documentation   Flowsheet Row Most Recent Value  Type of Advance Directive  Healthcare Power of Attorney, Living will  Pre-existing out of facility DNR order (yellow form or pink MOST form)   No data  "MOST" Form in Place?  No data      TOTAL TIME TAKING CARE OF THIS PATIENT: 28 minutes.    Demetrios Loll M.D on 05/17/2016 at 3:09 PM  Between 7am to 6pm - Pager - [phone]  After 6pm go to [email or web address] - Proofreader  [facility]  Office  [phone]  CC: Primary care physician; Juluis Pitch, MD   Note: This dictation was prepared with Dragon dictation along with smaller phrase technology. Any transcriptional errors that result from this process are unintentional.

## 2016-05-17 NOTE — Discharge Instructions (Signed)
Heart healthy and ADA diet. °

## 2017-08-25 HISTORY — PX: CARDIAC ELECTROPHYSIOLOGY STUDY AND ABLATION: SHX1294

## 2017-08-30 ENCOUNTER — Other Ambulatory Visit: Payer: Self-pay

## 2017-08-30 ENCOUNTER — Ambulatory Visit
Admission: EM | Admit: 2017-08-30 | Discharge: 2017-08-30 | Disposition: A | Discharge status: Home / Self Care | Payer: Medicare Other | Attending: Family Medicine | Admitting: Family Medicine

## 2017-08-30 DIAGNOSIS — Z944 Liver transplant status: Secondary | ICD-10-CM | POA: Diagnosis not present

## 2017-08-30 DIAGNOSIS — N410 Acute prostatitis: Secondary | ICD-10-CM

## 2017-08-30 DIAGNOSIS — I480 Paroxysmal atrial fibrillation: Secondary | ICD-10-CM | POA: Insufficient documentation

## 2017-08-30 DIAGNOSIS — R350 Frequency of micturition: Secondary | ICD-10-CM | POA: Diagnosis present

## 2017-08-30 DIAGNOSIS — K7460 Unspecified cirrhosis of liver: Secondary | ICD-10-CM | POA: Insufficient documentation

## 2017-08-30 DIAGNOSIS — Z79899 Other long term (current) drug therapy: Secondary | ICD-10-CM | POA: Insufficient documentation

## 2017-08-30 DIAGNOSIS — E875 Hyperkalemia: Secondary | ICD-10-CM | POA: Diagnosis not present

## 2017-08-30 DIAGNOSIS — E119 Type 2 diabetes mellitus without complications: Secondary | ICD-10-CM | POA: Insufficient documentation

## 2017-08-30 LAB — URINALYSIS, COMPLETE (UACMP) WITH MICROSCOPIC
Bacteria, UA: NONE SEEN
Bilirubin Urine: NEGATIVE
GLUCOSE, UA: NEGATIVE mg/dL
Ketones, ur: NEGATIVE mg/dL
Leukocytes, UA: NEGATIVE
Nitrite: NEGATIVE
PH: 5.5 (ref 5.0–8.0)
Protein, ur: NEGATIVE mg/dL
Specific Gravity, Urine: 1.015 (ref 1.005–1.030)
Squamous Epithelial / LPF: NONE SEEN

## 2017-08-30 MED ORDER — SULFAMETHOXAZOLE-TRIMETHOPRIM 800-160 MG PO TABS: 1.0000 | ORAL_TABLET | Freq: Two times a day (BID) | ORAL | 0 refills | Status: DC

## 2017-08-30 NOTE — ED Provider Notes (Signed)
[facility]    CSN: [id number] Arrival date & time: 08/30/17  8756     History   Chief Complaint Chief Complaint  Patient presents with  . Urinary Frequency    HPI [name] is a 77 y.o. male.   77 yo male with a c/o urinary urgency, frequency, incomplete emptying sensation, weak stream, for the past week. States prior to symptoms starting he had a cardiac ablation procedure and had a foley catheter placed. States symptoms began after this and not sure if this is related. Denies any fevers, chills, hematuria.    The history is provided by the patient.  Urinary Frequency     Past Medical History:  Diagnosis Date  . Atrial fibrillation ([place])   . Cirrhosis of liver ([place])   . Diabetes mellitus without complication ([place])   . Dysrhythmia   . Liver disease     Patient Active Problem List   Diagnosis Date Noted  . Hyperkalemia 05/16/2016  . Abnormal EKG 05/16/2016  . PAF (paroxysmal atrial fibrillation) ([place]) 05/16/2016  . Chest pain 05/16/2016    Past Surgical History:  Procedure Laterality Date  . CARDIAC ELECTROPHYSIOLOGY STUDY AND ABLATION  08/25/2017  . HERNIA REPAIR    . Liver Transplant Surgery         Home Medications    Prior to Admission medications   Medication Sig Start Date End Date Taking? Authorizing Provider  amLODipine (NORVASC) 5 MG tablet Take 1 tablet by mouth daily. 05/14/16  Yes [provider]  apixaban (ELIQUIS) 5 MG TABS tablet Take 5 mg by mouth 2 (two) times daily.   Yes [provider]  Cyanocobalamin (VITAMIN B-12 PO) Take 1 tablet by mouth daily.   Yes [provider]  cycloSPORINE (SANDIMMUNE) 100 MG capsule Take 75 mg by mouth 2 (two) times daily.   Yes [provider]  MAGNESIUM OXIDE PO Take 1 tablet by mouth 2 (two) times daily. Take 2 tablets at 1300 and 1 tablet at 1600   Yes [provider]  metoprolol tartrate (LOPRESSOR) 25 MG tablet Take 25 mg by mouth 2 (two)  times daily.   Yes [provider]  Multiple Vitamins-Minerals (MULTIVITAMIN ADULT) CHEW Chew 1 tablet by mouth daily.   Yes [provider]  mycophenolate (CELLCEPT) 250 MG capsule Take 250 mg by mouth 2 (two) times daily.   Yes [provider]  omeprazole (PRILOSEC) 20 MG capsule Take 20 mg by mouth daily.   Yes [provider]  predniSONE (DELTASONE) 5 MG tablet Take 5 mg by mouth daily with breakfast.   Yes [provider]  venlafaxine (EFFEXOR) 37.5 MG tablet Take 75 mg by mouth daily.    Yes [provider]  sulfamethoxazole-trimethoprim (BACTRIM DS,SEPTRA DS) 800-160 MG tablet Take 1 tablet by mouth 2 (two) times daily. 08/30/17   Norval Gable, MD    Family History Family History  Problem Relation Age of Onset  . Alcohol abuse Mother   . Emphysema Father     Social History Social History   Tobacco Use  . Smoking status: Never Smoker  . Smokeless tobacco: Never Used  Substance Use Topics  . Alcohol use: No  . Drug use: No     Allergies   Patient has no known allergies.   Review of Systems Review of Systems  Genitourinary: Positive for frequency.     Physical Exam Triage Vital Signs ED Triage Vitals  Enc Vitals Group     BP  08/30/17 0956 124/77     Pulse Rate 08/30/17 0956 82     Resp 08/30/17 0956 17     Temp 08/30/17 0956 98.5 F (36.9 C)     Temp Source 08/30/17 0956 Oral     SpO2 08/30/17 0956 98 %     Weight 08/30/17 0953 220 lb (99.8 kg)     Height 08/30/17 0953 5\' 8"  (1.727 m)     Head Circumference --      Peak Flow --      Pain Score 08/30/17 0953 4     Pain Loc --      Pain Edu? --      Excl. in [place]? --    No data found.  Updated Vital Signs BP 124/77 (BP Location: Right Arm)   Pulse 82   Temp 98.5 F (36.9 C) (Oral)   Resp 17   Ht 5\' 8"  (1.727 m)   Wt 220 lb (99.8 kg)   SpO2 98%   BMI 33.45 kg/m   Visual Acuity Right Eye Distance:   Left Eye Distance:   Bilateral Distance:     Right Eye Near:   Left Eye Near:    Bilateral Near:     Physical Exam  Constitutional: He appears well-developed and well-nourished. No distress.  Genitourinary: Prostate is tender (soft).  Skin: He is not diaphoretic.  Nursing note and vitals reviewed.    UC Treatments / Results  Labs (all labs ordered are listed, but only abnormal results are displayed) Labs Reviewed  URINALYSIS, COMPLETE (UACMP) WITH MICROSCOPIC - Abnormal; Notable for the following components:      Result Value   Hgb urine dipstick TRACE (*)    All other components within normal limits    EKG  EKG Interpretation None       Radiology No results found.  Procedures Procedures (including critical care time)  Medications Ordered in UC Medications - No data to display   Initial Impression / Assessment and Plan / UC Course  I have reviewed the triage vital signs and the nursing notes.  Pertinent labs & imaging results that were available during my care of the patient were reviewed by me and considered in my medical decision making (see chart for details).       Final Clinical Impressions(s) / UC Diagnoses   Final diagnoses:  Acute prostatitis    ED Discharge Orders        Ordered    sulfamethoxazole-trimethoprim (BACTRIM DS,SEPTRA DS) 800-160 MG tablet  2 times daily     08/30/17 1108     1.diagnosis reviewed with patient 2. rx as per orders above; reviewed po sible side effects, interactions, risks and benefits 3. Increase fluids 4. Follow-up prn if symptoms worsen or don't improve  Controlled Substance Prescriptions [place] Controlled Substance Registry consulted? Not Applicable   Norval Gable, MD 08/30/17 [phone]

## 2017-08-30 NOTE — Discharge Instructions (Signed)
Drink plenty of water.

## 2017-08-30 NOTE — ED Triage Notes (Signed)
Patient complains of urinary urgency, decreased output, frequency. Patient states that this started on Wednesday after having a cardiac ablation and was placed on a catheter. Patient states that he has noticed some discomfort in his prostate area. Patient noted that he is S/P liver transplant 2014.

## 2017-09-09 ENCOUNTER — Telehealth: Payer: Self-pay | Admitting: Emergency Medicine

## 2017-09-09 NOTE — Telephone Encounter (Signed)
Theadore Nan Transplant Coordinator [phone]) called stating that patient was seen here on 3/18 and treated for Prostatitis with Bactrim x 14 days. (Patient has taken for ~ 10 days) Gregary Signs states that patient had routine lab work today and his K+ and Creatinine were mildly elevated. She states that it is probably from the antibiotic. She is asking if we want to d/c the antibiotics, change the antibiotics or if patient needs to be re-evaluated since symptoms haven't resolved?   I called patient to discuss after discussing with Dr. Lacinda Axon and I advised he should probably follow up with Dr. Lovie Macadamia (PCP) due to abnormal labs. Patient voiced understanding and plans to call 1st thing in the morning since the office is already closed. I agreed and told him that if Dr. Lovie Macadamia couldn't see him tomorrow to come back in to Urgent Care for re-evaluation. Patient agreed and voiced understanding.  Called Gregary Signs back and advised of above and advised that Dr. Lacinda Axon stated that if they felt the labs were elevated due to the medication, then patient should stop antibiotic and be re-evaluated. Gregary Signs agreed and voiced understanding.

## 2020-03-31 ENCOUNTER — Ambulatory Visit
Admission: EM | Admit: 2020-03-31 | Discharge: 2020-03-31 | Disposition: A | Discharge status: Home / Self Care | Payer: Medicare Other | Attending: Physician Assistant | Admitting: Physician Assistant

## 2020-03-31 ENCOUNTER — Encounter: Payer: Self-pay | Admitting: Emergency Medicine

## 2020-03-31 ENCOUNTER — Other Ambulatory Visit: Payer: Self-pay

## 2020-03-31 DIAGNOSIS — M109 Gout, unspecified: Secondary | ICD-10-CM

## 2020-03-31 DIAGNOSIS — M79671 Pain in right foot: Secondary | ICD-10-CM

## 2020-03-31 MED ORDER — PREDNISONE 10 MG PO TABS: ORAL_TABLET | ORAL | 0 refills | Status: DC

## 2020-03-31 MED ORDER — TRAMADOL HCL 50 MG PO TABS: 50.0000 mg | ORAL_TABLET | Freq: Three times a day (TID) | ORAL | 0 refills | Status: AC | PRN

## 2020-03-31 NOTE — ED Provider Notes (Signed)
[facility]    CSN: [id number] Arrival date & time: 03/31/20  0809      History   Chief Complaint Chief Complaint  Patient presents with  . Foot Pain    right    HPI [name] is a 79 y.o. male presenting for 4-day history of right foot pain.  He says he has a history of gout and is currently having a flareup.  He saw his podiatrist 3 days ago and was given an IM injection of triamcinolone, which she said did not seem to help.  He is also taken Tylenol without relief of the pain.  Patient is a liver transplant patient has a history of stage III kidney disease, diabetes with use of insulin, atrial fibrillation, and long-term use of anticoagulants-Eliquis.  Patient also takes 5 mg prednisone daily.  Patient denies any worsening of pain.  He admits to redness, swelling and tenderness of the first MTP.  He denies any fevers.  No injury noted.   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  No other complaints or concerns.  HPI  Past Medical History:  Diagnosis Date  . Atrial fibrillation ([place])   . Cirrhosis of liver ([place])   . Diabetes mellitus without complication ([place])   . Dysrhythmia   . Liver disease     Patient Active Problem List   Diagnosis Date Noted  . Hyperkalemia 05/16/2016  . Abnormal EKG 05/16/2016  . PAF (paroxysmal atrial fibrillation) ([place]) 05/16/2016  . Chest pain 05/16/2016    Past Surgical History:  Procedure Laterality Date  . CARDIAC ELECTROPHYSIOLOGY STUDY AND ABLATION  08/25/2017  . HERNIA REPAIR    . Liver Transplant Surgery         Home Medications    Prior to Admission medications   Medication Sig Start Date End Date Taking? Authorizing Provider  amLODipine (NORVASC) 5 MG tablet Take 1 tablet by mouth daily. 05/14/16  Yes [provider]  apixaban (ELIQUIS) 5 MG TABS tablet Take 5 mg by mouth 2 (two) times daily.   Yes [provider]  Cyanocobalamin (VITAMIN B-12 PO) Take 1 tablet by mouth daily.    Yes [provider]  cycloSPORINE (SANDIMMUNE) 100 MG capsule Take 75 mg by mouth 2 (two) times daily.   Yes [provider]  MAGNESIUM OXIDE PO Take 1 tablet by mouth 2 (two) times daily. Take 2 tablets at 1300 and 1 tablet at 1600   Yes [provider]  metoprolol tartrate (LOPRESSOR) 25 MG tablet Take 25 mg by mouth 2 (two) times daily.   Yes [provider]  Multiple Vitamins-Minerals (MULTIVITAMIN ADULT) CHEW Chew 1 tablet by mouth daily.   Yes [provider]  mycophenolate (CELLCEPT) 250 MG capsule Take 250 mg by mouth 2 (two) times daily.   Yes [provider]  omeprazole (PRILOSEC) 20 MG capsule Take 20 mg by mouth daily.   Yes [provider]  predniSONE (DELTASONE) 5 MG tablet Take 5 mg by mouth daily with breakfast.   Yes [provider]  venlafaxine (EFFEXOR) 37.5 MG tablet Take 75 mg by mouth daily.    Yes [provider]  predniSONE (DELTASONE) 10 MG tablet Take 4 tabs PO x 2 days, 3 tabs x 2 days, 2 tabs x 2 days, 1 tab x 1 day 03/31/20   Laurene Footman B, PA-C  sulfamethoxazole-trimethoprim (BACTRIM DS,SEPTRA DS) 800-160 MG tablet Take 1 tablet by mouth 2 (two) times daily. 08/30/17  Norval Gable, MD  traMADol (ULTRAM) 50 MG tablet Take 1 tablet (50 mg total) by mouth every 8 (eight) hours as needed for up to 5 days. 03/31/20 04/05/20  Danton Clap, PA-C    Family History Family History  Problem Relation Age of Onset  . Alcohol abuse Mother   . Emphysema Father     Social History Social History   Tobacco Use  . Smoking status: Never Smoker  . Smokeless tobacco: Never Used  Vaping Use  . Vaping Use: Never used  Substance Use Topics  . Alcohol use: No  . Drug use: No     Allergies   Ibuprofen   Review of Systems Review of Systems  Constitutional: Negative for fatigue and fever.  Musculoskeletal: Positive for arthralgias and joint swelling. Negative for gait problem.  Skin:  Positive for color change. Negative for rash and wound.  Neurological: Positive for numbness. Negative for weakness.     Physical Exam Triage Vital Signs ED Triage Vitals  Enc Vitals Group     BP 03/31/20 0824 (!) 134/95     Pulse Rate 03/31/20 0824 84     Resp 03/31/20 0824 16     Temp 03/31/20 0824 98.5 F (36.9 C)     Temp Source 03/31/20 0824 Oral     SpO2 03/31/20 0824 99 %     Weight 03/31/20 0821 190 lb (86.2 kg)     Height 03/31/20 0821 5\' 8"  (1.727 m)     Head Circumference --      Peak Flow --      Pain Score 03/31/20 0820 5     Pain Loc --      Pain Edu? --      Excl. in [place]? --    No data found.  Updated Vital Signs BP (!) 134/95 (BP Location: Right Arm)   Pulse 84   Temp 98.5 F (36.9 C) (Oral)   Resp 16   Ht 5\' 8"  (1.727 m)   Wt 190 lb (86.2 kg)   SpO2 99%   BMI 28.89 kg/m       Physical Exam Vitals and nursing note reviewed.  Constitutional:      General: He is not in acute distress.    Appearance: Normal appearance. He is well-developed. He is not toxic-appearing.  HENT:     Head: Normocephalic and atraumatic.  Eyes:     General: No scleral icterus.    Conjunctiva/sclera: Conjunctivae normal.  Cardiovascular:     Rate and Rhythm: Normal rate.     Pulses: Normal pulses.  Pulmonary:     Effort: Pulmonary effort is normal. No respiratory distress.  Musculoskeletal:     Cervical back: Neck supple.     Right foot: Normal range of motion and normal capillary refill. Swelling (moderate swelling, erythema, warmth right first MTP. With associated TTP) and bony tenderness (1st MTP) present. Normal pulse.     Left foot: Normal pulse.  Skin:    General: Skin is warm and dry.  Neurological:     General: No focal deficit present.     Mental Status: He is alert. Mental status is at baseline.     Motor: No weakness.     Gait: Gait normal.  Psychiatric:        Mood and Affect: Mood normal.        Behavior: Behavior normal.        Thought Content:  Thought content normal.      UC  Treatments / Results  Labs (all labs ordered are listed, but only abnormal results are displayed) Labs Reviewed - No data to display  EKG   Radiology No results found.  Procedures Procedures (including critical care time)  Medications Ordered in UC Medications - No data to display  Initial Impression / Assessment and Plan / UC Course  I have reviewed the triage vital signs and the nursing notes.  Pertinent labs & imaging results that were available during my care of the patient were reviewed by me and considered in my medical decision making (see chart for details).   79 year old male with gout history of presenting for suspected flareup of gout of the right foot.  Reviewed notes from podiatrist from 03/28/2020.  Patient given triamcinolone injection IM.  Patient requesting colchicine, but there are many contraindications of this including renal and hepatic impairment which patient has.  Advised I would like to hold off on using this medication.  Explained that the likely safest thing would be to put him on a prednisone taper for a few days and then have him go back to taking his prednisone as prescribed.  I would like him to contact his liver transplant coordinator to inform them of this and make sure that it is safe for him to do.  He states that he was previously on 30 mg of prednisone at one time right after he had the transplant which was 7 years ago.  Short-term supply of Ultram provided to patient as he has taken this medication before.  Controlled substance database reviewed and patient low risk for abuse.  Advised ice and elevation.  Advised to follow-up with podiatrist if not getting better that he would likely need to consider possible joint injection of a corticosteroid.  Patient understanding and agreeable.    Final Clinical Impressions(s) / UC Diagnoses   Final diagnoses:  Acute gout of right foot, unspecified cause  Foot pain, right      Discharge Instructions     Stop taking your at home prednisone while you take the prednisone that I prescribed for you today.  Take the Ultram as needed for severe pain.  Do not take Tylenol with this medication.  Do not take NSAIDs.  Follow-up with your liver transplant coordinator just to make sure that these medications are fine for you to take.  Follow-up with podiatrist if these medications are not helping you and consider joint injection.   ED Prescriptions    Medication Sig Dispense Auth. Provider   predniSONE (DELTASONE) 10 MG tablet Take 4 tabs PO x 2 days, 3 tabs x 2 days, 2 tabs x 2 days, 1 tab x 1 day 19 tablet Laurene Footman B, PA-C   traMADol (ULTRAM) 50 MG tablet Take 1 tablet (50 mg total) by mouth every 8 (eight) hours as needed for up to 5 days. 15 tablet Danton Clap, PA-C     I have reviewed the PDMP during this encounter.   Laurene Footman B, PA-C 03/31/20 0900

## 2020-03-31 NOTE — ED Triage Notes (Signed)
Patient pain in his right big toe and foot that started on Wed.  Patient denies injury or fall.  Patient states that he is a liver transplant patient.  Patient reports history of gout.

## 2020-03-31 NOTE — Discharge Instructions (Signed)
Stop taking your at home prednisone while you take the prednisone that I prescribed for you today.  Take the Ultram as needed for severe pain.  Do not take Tylenol with this medication.  Do not take NSAIDs.  Follow-up with your liver transplant coordinator just to make sure that these medications are fine for you to take.  Follow-up with podiatrist if these medications are not helping you and consider joint injection.

## 2020-05-24 DIAGNOSIS — K219 Gastro-esophageal reflux disease without esophagitis: Secondary | ICD-10-CM | POA: Insufficient documentation

## 2020-05-24 DIAGNOSIS — F32A Depression, unspecified: Secondary | ICD-10-CM | POA: Insufficient documentation

## 2020-05-27 ENCOUNTER — Other Ambulatory Visit: Payer: Self-pay | Admitting: Orthopedic Surgery

## 2020-05-27 DIAGNOSIS — M9953 Intervertebral disc stenosis of neural canal of lumbar region: Secondary | ICD-10-CM

## 2020-05-27 DIAGNOSIS — M4807 Spinal stenosis, lumbosacral region: Secondary | ICD-10-CM

## 2020-05-31 ENCOUNTER — Ambulatory Visit: Payer: Medicare Other

## 2020-06-04 ENCOUNTER — Other Ambulatory Visit: Payer: Self-pay

## 2020-06-04 ENCOUNTER — Ambulatory Visit
Admission: RE | Admit: 2020-06-04 | Discharge: 2020-06-04 | Disposition: A | Discharge status: Home / Self Care | Payer: Medicare Other | Source: Ambulatory Visit | Attending: Orthopedic Surgery | Admitting: Orthopedic Surgery

## 2020-06-04 DIAGNOSIS — M4807 Spinal stenosis, lumbosacral region: Secondary | ICD-10-CM | POA: Insufficient documentation

## 2020-06-04 DIAGNOSIS — M9953 Intervertebral disc stenosis of neural canal of lumbar region: Secondary | ICD-10-CM

## 2020-06-06 ENCOUNTER — Ambulatory Visit
Admission: EM | Admit: 2020-06-06 | Discharge: 2020-06-06 | Disposition: A | Discharge status: Home / Self Care | Payer: Medicare Other | Attending: Family Medicine | Admitting: Family Medicine

## 2020-06-06 ENCOUNTER — Other Ambulatory Visit: Payer: Self-pay

## 2020-06-06 DIAGNOSIS — I4891 Unspecified atrial fibrillation: Secondary | ICD-10-CM

## 2020-06-06 MED ORDER — METOPROLOL TARTRATE 25 MG PO TABS: 25.0000 mg | ORAL_TABLET | Freq: Two times a day (BID) | ORAL | 0 refills | Status: AC

## 2020-06-06 NOTE — ED Provider Notes (Signed)
[facility]    CSN: [id number] Arrival date & time: 06/06/20  1451      History   Chief Complaint Chief Complaint  Patient presents with  . Panic Attack   HPI  79 year old male presents with reported anxiety.  Patient states that he recently had an MRI on Tuesday and was given diazepam. He states that since that time he has not felt well. He has felt jittery and sweaty at times. 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 He does not feel like his normal self. Patient is also recently realized that he is back in A. fib. He is not currently on any medications for rate control. He has home medications regarding his mood and anxiety as well as PTSD. No other reported symptoms.   Past Medical History:  Diagnosis Date  . Atrial fibrillation ([place])   . Cirrhosis of liver ([place])   . Diabetes mellitus without complication ([place])   . Dysrhythmia   . Liver disease    Patient Active Problem List   Diagnosis Date Noted  . Hyperkalemia 05/16/2016  . Abnormal EKG 05/16/2016  . PAF (paroxysmal atrial fibrillation) ([place]) 05/16/2016  . Chest pain 05/16/2016   Past Surgical History:  Procedure Laterality Date  . CARDIAC ELECTROPHYSIOLOGY STUDY AND ABLATION  08/25/2017  . HERNIA REPAIR    . Liver Transplant Surgery     Home Medications    Prior to Admission medications   Medication Sig Start Date End Date Taking? Authorizing Provider  amLODipine (NORVASC) 5 MG tablet Take 1 tablet by mouth daily. 05/14/16  Yes [provider]  apixaban (ELIQUIS) 5 MG TABS tablet Take 5 mg by mouth 2 (two) times daily.   Yes [provider]  Cyanocobalamin (VITAMIN B-12 PO) Take 1 tablet by mouth daily.   Yes [provider]  cycloSPORINE (SANDIMMUNE) 100 MG capsule Take 75 mg by mouth 2 (two) times daily.   Yes [provider]  MAGNESIUM OXIDE PO Take 1 tablet by mouth 2 (two) times daily. Take 2 tablets at  1300 and 1 tablet at 1600   Yes [provider]  Multiple Vitamins-Minerals (MULTIVITAMIN ADULT) CHEW Chew 1 tablet by mouth daily.   Yes [provider]  mycophenolate (CELLCEPT) 250 MG capsule Take 250 mg by mouth 2 (two) times daily.   Yes [provider]  omeprazole (PRILOSEC) 20 MG capsule Take 20 mg by mouth daily.   Yes [provider]  predniSONE (DELTASONE) 5 MG tablet Take 5 mg by mouth daily with breakfast.   Yes [provider]  venlafaxine (EFFEXOR) 37.5 MG tablet Take 75 mg by mouth daily.    Yes [provider]  metoprolol tartrate (LOPRESSOR) 25 MG tablet Take 1 tablet (25 mg total) by mouth 2 (two) times daily. 06/06/20   Coral Spikes, DO    Family History Family History  Problem Relation Age of Onset  . Alcohol abuse Mother   . Emphysema Father     Social History Social History   Tobacco Use  . Smoking status: Never Smoker  . Smokeless tobacco: Never Used  Vaping Use  . Vaping Use: Never used  Substance Use Topics  . Alcohol use: No  . Drug use: No     Allergies   Ibuprofen   Review of Systems Review of Systems Per HPI  Physical Exam Triage Vital Signs ED Triage Vitals  Enc Vitals Group  BP 06/06/20 1558 (!) 132/107     Pulse Rate 06/06/20 1558 96     Resp 06/06/20 1558 18     Temp 06/06/20 1558 97.7 F (36.5 C)     Temp Source 06/06/20 1558 Oral     SpO2 06/06/20 1558 100 %     Weight 06/06/20 1556 200 lb (90.7 kg)     Height 06/06/20 1556 5\' 7"  (1.702 m)     Head Circumference --      Peak Flow --      Pain Score 06/06/20 1556 8     Pain Loc --      Pain Edu? --      Excl. in [place]? --    Updated Vital Signs BP (!) 132/107 (BP Location: Right Arm)   Pulse 96 Comment: irregular  Temp 97.7 F (36.5 C) (Oral)   Resp 18   Ht 5\' 7"  (1.702 m)   Wt 90.7 kg   SpO2 100%   BMI 31.32 kg/m   Visual Acuity Right Eye Distance:   Left Eye Distance:   Bilateral Distance:    Right Eye  Near:   Left Eye Near:    Bilateral Near:     Physical Exam Vitals and nursing note reviewed.  Constitutional:      General: He is not in acute distress.    Appearance: Normal appearance. He is not ill-appearing.  HENT:     Head: Normocephalic and atraumatic.  Eyes:     General:        Right eye: No discharge.        Left eye: No discharge.     Conjunctiva/sclera: Conjunctivae normal.  Cardiovascular:     Rate and Rhythm: Tachycardia present. Rhythm irregularly irregular.  Pulmonary:     Effort: Pulmonary effort is normal.     Breath sounds: Normal breath sounds. No wheezing, rhonchi or rales.  Neurological:     Mental Status: He is alert.  Psychiatric:        Mood and Affect: Mood normal.        Behavior: Behavior normal.    UC Treatments / Results  Labs (all labs ordered are listed, but only abnormal results are displayed) Labs Reviewed - No data to display  EKG Interpretation: Atrial fibrillation with rapid ventricular response at the rate of 101.  Radiology No results found.  Procedures Procedures (including critical care time)  Medications Ordered in UC Medications - No data to display  Initial Impression / Assessment and Plan / UC Course  I have reviewed the triage vital signs and the nursing notes.  Pertinent labs & imaging results that were available during my care of the patient were reviewed by me and considered in my medical decision making (see chart for details).    79 year old male presents with anxiety. Currently in Afib at rate of 101. This is the likely culprit of his symptoms.  Patient is on nothing for rate control.  Restarting metoprolol, 25 mg twice daily.  Final Clinical Impressions(s) / UC Diagnoses   Final diagnoses:  Atrial fibrillation, unspecified type [facility])   Discharge Instructions   None    ED Prescriptions    Medication Sig Dispense Auth. Provider   metoprolol tartrate (LOPRESSOR) 25 MG tablet Take 1 tablet (25 mg total) by  mouth 2 (two) times daily. 180 tablet Tommie Sams, DO     PDMP not reviewed this encounter.   Tommie Sams, [place] 06/06/20 1941

## 2020-06-06 NOTE — ED Triage Notes (Signed)
Patient presents to [facility] with his daughter who is helping provide history. Patient states that he had a MRI on Tuesday and they gave him diazepam. States that since then he has been having panic attacks. Reports that he has been jittery when these occur, sweating and weak feeling. 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   Patient states that he was having a MRI of his spine and his back pain has been worsening. States that he was unsure if the pain was causing his symptoms.

## 2020-07-17 DIAGNOSIS — F419 Anxiety disorder, unspecified: Secondary | ICD-10-CM | POA: Insufficient documentation

## 2020-10-24 ENCOUNTER — Other Ambulatory Visit: Payer: Self-pay

## 2020-10-24 ENCOUNTER — Ambulatory Visit
Admission: RE | Admit: 2020-10-24 | Discharge: 2020-10-24 | Disposition: A | Discharge status: Home / Self Care | Payer: Medicare Other | Source: Ambulatory Visit | Attending: Family Medicine | Admitting: Family Medicine

## 2020-10-24 VITALS — BP 95/75 | HR 76 | Temp 98.8°F | Resp 18 | Ht 67.75 in | Wt 205.0 lb

## 2020-10-24 DIAGNOSIS — I10 Essential (primary) hypertension: Secondary | ICD-10-CM | POA: Insufficient documentation

## 2020-10-24 DIAGNOSIS — J069 Acute upper respiratory infection, unspecified: Secondary | ICD-10-CM

## 2020-10-24 DIAGNOSIS — Z20822 Contact with and (suspected) exposure to covid-19: Secondary | ICD-10-CM | POA: Diagnosis not present

## 2020-10-24 DIAGNOSIS — Z944 Liver transplant status: Secondary | ICD-10-CM | POA: Insufficient documentation

## 2020-10-24 LAB — SARS CORONAVIRUS 2 (TAT 6-24 HRS): SARS Coronavirus 2: NEGATIVE

## 2020-10-24 MED ORDER — IPRATROPIUM BROMIDE 0.06 % NA SOLN: 2.0000 | Freq: Four times a day (QID) | NASAL | 12 refills | Status: DC

## 2020-10-24 MED ORDER — BENZONATATE 100 MG PO CAPS: 200.0000 mg | ORAL_CAPSULE | Freq: Three times a day (TID) | ORAL | 0 refills | Status: DC

## 2020-10-24 NOTE — ED Triage Notes (Signed)
Pt c/o fever (99.6), cough, fatigue, muscle and body aches, sore throat, nasal congestion since Tuesday. Pt is concerned about COVID and would like to be tested.

## 2020-10-24 NOTE — Discharge Instructions (Addendum)
Isolate at home pending the results of your COVID test.  If you test positive then you will have to quarantine for 5 days from the start of your symptoms.  After 5 days you can break quarantine if your symptoms have improved and you have not had a fever for 24 hours without taking Tylenol or ibuprofen.  Use over-the-counter Tylenol and ibuprofen as needed for body aches and fever.  Use the Tessalon Perles during the day as needed for cough.  The Atrovent nasal spray, 2 squirts up each nostril 4 times a day as needed for nasal congestion and postnasal drip.  If you test positive for COVID we will refer you to the infusion clinic and they will evaluate you for antiviral therapy.  If you develop any increased shortness of breath-especially at rest, you are unable to speak in full sentences, or is a late sign your lips are turning blue you need to go the ER for evaluation.

## 2020-10-24 NOTE — ED Provider Notes (Signed)
[facility]    CSN: [id number] Arrival date & time: 10/24/20  1045      History   Chief Complaint Chief Complaint  Patient presents with  . Nasal Congestion  . Sore Throat  . Generalized Body Aches    HPI [name] is a 80 y.o. male.   HPI   80 year old male here for evaluation of cold symptoms.  Patient reports that he has been experiencing a scratchy throat, fever with a T-max of 99.8, body aches, nasal congestion, and nonproductive cough for the last 2 days.  He denies any sick contacts but he did spend several hours at the [facility] 3 days ago for preop appointments for cataract surgery and there were a lot of other patients there.  Patient denies runny nose, ear pain, shortness breath or wheezing, or GI complaints.  He does complain of some itching in his eustachian tubes.  Patient has had his COVID-vaccine +2 booster shots and his flu shot.  Past Medical History:  Diagnosis Date  . Atrial fibrillation ([place])   . Cirrhosis of liver ([place])   . Diabetes mellitus without complication ([place])   . Dysrhythmia   . Liver disease     Patient Active Problem List   Diagnosis Date Noted  . Hypertension 10/24/2020  . 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 Anxiety 07/17/2020  . Depression 05/24/2020  . Gastroesophageal reflux disease 05/24/2020  . Hypomagnesemia 11/28/2019  . Hyperkalemia 05/16/2016  . Abnormal EKG 05/16/2016  . PAF (paroxysmal atrial fibrillation) ([place]) 05/16/2016  . Chest pain 05/16/2016  . Benign essential HTN 05/14/2016  . Mixed hyperlipidemia 04/09/2015  . 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  . 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 Chronic kidney disease (CKD), stage III (moderate) ([place]) 03/08/2013  . 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 CARDIAC ELECTROPHYSIOLOGY STUDY AND ABLATION  08/25/2017  . HERNIA REPAIR    . Liver Transplant Surgery         Home Medications    Prior to Admission medications   Medication Sig Start Date End Date Taking? Authorizing Provider  amLODipine (NORVASC) 5 MG tablet Take 1 tablet by mouth daily. 05/14/16  Yes [provider]  apixaban (ELIQUIS) 5 MG TABS tablet Take 5 mg by mouth 2 (two) times daily.   Yes [provider]  benzonatate (TESSALON) 100 MG capsule Take 2 capsules (200 mg total) by mouth every 8 (eight) hours. 10/24/20  Yes Margarette Canada, NP  Calcium Carb-Cholecalciferol 600-400 MG-UNIT TABS Take by mouth. 08/02/13  Yes [provider]  Cholecalciferol 25 MCG (1000 UT) tablet Take 3 tablets by mouth daily. 04/26/20  Yes [provider]  Cyanocobalamin (VITAMIN B-12 PO) Take 1 tablet by mouth daily.   Yes [provider]  cycloSPORINE modified (GENGRAF) 25 MG capsule Take 2 capsules by mouth 2 (two) times daily. 08/15/20  Yes [provider]  insulin detemir (LEVEMIR) 100 UNIT/ML injection Inject 18 Units into the skin at bedtime. 09/09/17  Yes [provider]  ipratropium (ATROVENT) 0.06 % nasal spray Place 2 sprays into both nostrils 4 (four) times daily. 10/24/20  Yes Margarette Canada, NP  magnesium oxide (MAG-OX) 400 (240 Mg) MG tablet Take 2 tablets by mouth. 07/02/16  Yes [provider]  MAGNESIUM OXIDE  PO Take 1 tablet by mouth 2 (two) times daily. Take 2 tablets at 1300 and 1 tablet at 1600   Yes [provider]  metoprolol tartrate (LOPRESSOR) 25 MG tablet Take 1 tablet (25 mg total) by mouth 2 (two) times daily. 06/06/20  Yes Cook, Jayce G, DO  Multiple Vitamins-Minerals (MULTIVITAMIN ADULT) CHEW Chew 1 tablet by mouth daily.   Yes [provider]  mycophenolate (CELLCEPT) 250 MG capsule Take 250 mg by mouth 2 (two) times  daily.   Yes [provider]  omeprazole (PRILOSEC) 20 MG capsule Take 20 mg by mouth daily.   Yes [provider]  pravastatin (PRAVACHOL) 40 MG tablet Take 0.5 tablets by mouth. 07/15/20  Yes [provider]  predniSONE (DELTASONE) 5 MG tablet Take 5 mg by mouth daily with breakfast.   Yes [provider]  venlafaxine (EFFEXOR) 37.5 MG tablet Take 75 mg by mouth daily.    Yes [provider]    Family History Family History  Problem Relation Age of Onset  . Alcohol abuse Mother   . Emphysema Father     Social History Social History   Tobacco Use  . Smoking status: Never Smoker  . Smokeless tobacco: Never Used  Vaping Use  . Vaping Use: Never used  Substance Use Topics  . Alcohol use: No  . Drug use: No     Allergies   Ibuprofen   Review of Systems Review of Systems  Constitutional: Positive for fatigue. Negative for activity change, appetite change and fever.  HENT: Positive for congestion, postnasal drip and sore throat. Negative for ear pain and rhinorrhea.   Respiratory: Positive for cough. Negative for shortness of breath and wheezing.   Gastrointestinal: Negative for diarrhea, nausea and vomiting.  Musculoskeletal: Positive for arthralgias and myalgias.  Skin: Negative for rash.  Hematological: Negative.   Psychiatric/Behavioral: Negative.      Physical Exam Triage Vital Signs ED Triage Vitals  Enc Vitals Group     BP 10/24/20 1107 95/75     Pulse Rate 10/24/20 1107 76     Resp 10/24/20 1107 18     Temp 10/24/20 1107 98.8 F (37.1 C)     Temp Source 10/24/20 1107 Oral     SpO2 10/24/20 1107 100 %     Weight 10/24/20 1102 205 lb (93 kg)     Height 10/24/20 1102 5' 7.75" (1.721 m)     Head Circumference --      Peak Flow --      Pain Score 10/24/20 1102 0     Pain Loc --      Pain Edu? --      Excl. in [place]? --    No data found.  Updated Vital Signs BP 95/75 (BP Location: Left Arm)   Pulse 76   Temp  98.8 F (37.1 C) (Oral)   Resp 18   Ht 5' 7.75" (1.721 m)   Wt 205 lb (93 kg)   SpO2 100%   BMI 31.40 kg/m   Visual Acuity Right Eye Distance:   Left Eye Distance:   Bilateral Distance:    Right Eye Near:   Left Eye Near:    Bilateral Near:     Physical Exam Vitals and nursing note reviewed.  Constitutional:      General: He is not in acute distress.    Appearance: Normal appearance. He is normal weight. He is not ill-appearing.  HENT:     Head: Normocephalic and  atraumatic.     Right Ear: Tympanic membrane, ear canal and external ear normal. There is no impacted cerumen.     Left Ear: Tympanic membrane, ear canal and external ear normal. There is no impacted cerumen.     Nose: Congestion present. No rhinorrhea.     Mouth/Throat:     Mouth: Mucous membranes are moist.     Pharynx: Oropharynx is clear. Posterior oropharyngeal erythema present.  Cardiovascular:     Rate and Rhythm: Normal rate and regular rhythm.     Pulses: Normal pulses.     Heart sounds: Normal heart sounds. No murmur heard. No gallop.   Pulmonary:     Effort: Pulmonary effort is normal.     Breath sounds: Normal breath sounds. No wheezing, rhonchi or rales.  Musculoskeletal:     Cervical back: Normal range of motion and neck supple.  Lymphadenopathy:     Cervical: No cervical adenopathy.  Skin:    General: Skin is warm and dry.     Capillary Refill: Capillary refill takes less than 2 seconds.     Findings: No erythema or rash.  Neurological:     General: No focal deficit present.     Mental Status: He is alert and oriented to person, place, and time.  Psychiatric:        Mood and Affect: Mood normal.        Behavior: Behavior normal.        Thought Content: Thought content normal.        Judgment: Judgment normal.      UC Treatments / Results  Labs (all labs ordered are listed, but only abnormal results are displayed) Labs Reviewed  SARS CORONAVIRUS 2 (TAT 6-24 HRS)     EKG   Radiology No results found.  Procedures Procedures (including critical care time)  Medications Ordered in UC Medications - No data to display  Initial Impression / Assessment and Plan / UC Course  I have reviewed the triage vital signs and the nursing notes.  Pertinent labs & imaging results that were available during my care of the patient were reviewed by me and considered in my medical decision making (see chart for details).   Patient is a very pleasant and nontoxic-appearing 80 year old male here for evaluation of cold symptoms started 2 days ago.  He has been vaccinated and twice posted against COVID and has received his flu shot but he is concerned because he spent several hours at the [facility] for preop surgery for cataract surgery 4 days ago and there were a lot of people present at that time.  Patient was masked.  Physical exam reveals pearly gray tympanic membranes bilaterally with a normal light reflex and clear external auditory canals.  Nasal mucosa is mildly erythematous and edematous without nasal discharge.  Posterior oropharynx reveals oropharyngeal erythema and cobblestoning with clear postnasal drip.  No cervical lymphadenopathy on exam.  Lungs are clear to auscultation all fields.  Patient's exam is consistent with a URI and postnasal drip.  We will treat with Tessalon Perles and ipratropium nasal spray.  Patient reports that his cough is not not significant at night.  COVID swab is pending.  We will discharge patient home to isolate.   Final Clinical Impressions(s) / UC Diagnoses   Final diagnoses:  Viral URI with cough     Discharge Instructions     Isolate at home pending the results of your COVID test.  If you test positive then you  will have to quarantine for 5 days from the start of your symptoms.  After 5 days you can break quarantine if your symptoms have improved and you have not had a fever for 24 hours without taking Tylenol or  ibuprofen.  Use over-the-counter Tylenol and ibuprofen as needed for body aches and fever.  Use the Tessalon Perles during the day as needed for cough.  The Atrovent nasal spray, 2 squirts up each nostril 4 times a day as needed for nasal congestion and postnasal drip.  If you test positive for COVID we will refer you to the infusion clinic and they will evaluate you for antiviral therapy.  If you develop any increased shortness of breath-especially at rest, you are unable to speak in full sentences, or is a late sign your lips are turning blue you need to go the ER for evaluation.     ED Prescriptions    Medication Sig Dispense Auth. Provider   ipratropium (ATROVENT) 0.06 % nasal spray Place 2 sprays into both nostrils 4 (four) times daily. 15 mL Margarette Canada, NP   benzonatate (TESSALON) 100 MG capsule Take 2 capsules (200 mg total) by mouth every 8 (eight) hours. 21 capsule Margarette Canada, NP     PDMP not reviewed this encounter.   Margarette Canada, NP 10/24/20 1136

## 2020-10-28 ENCOUNTER — Ambulatory Visit: Payer: Self-pay

## 2020-11-11 ENCOUNTER — Ambulatory Visit
Admission: EM | Admit: 2020-11-11 | Discharge: 2020-11-11 | Disposition: A | Discharge status: Home / Self Care | Payer: Medicare Other | Attending: Physician Assistant | Admitting: Physician Assistant

## 2020-11-11 ENCOUNTER — Other Ambulatory Visit: Payer: Self-pay

## 2020-11-11 DIAGNOSIS — M109 Gout, unspecified: Secondary | ICD-10-CM

## 2020-11-11 MED ORDER — PREDNISONE 20 MG PO TABS: 20.0000 mg | ORAL_TABLET | Freq: Every day | ORAL | 0 refills | Status: AC

## 2020-11-11 NOTE — ED Provider Notes (Addendum)
[facility]    CSN: [id number] Arrival date & time: 11/11/20  7829      History   Chief Complaint Chief Complaint  Patient presents with  . Gout    HPI [name] is a 80 y.o. male resenting for acute flareup of his gout since yesterday.  Patient states that earlier this month he was treated for a gout flareup of the left great toe with 10-day course of 20 mg of prednisone by his PCP.  Patient admits to history of gout.  Patient says that the prednisone completely relieved his symptoms and then they returned today.  He says he has not had a flareup in a couple of years before the past month.  Patient has a previous medical history of alcoholism and liver transplant.  He no longer drinks alcohol and says he has not been anything different with his diet that could have flared up his gout to his knowledge.  He denies any fever or fatigue.  He says the area is swollen and red.  No other concerns.  HPI  Past Medical History:  Diagnosis Date  . Atrial fibrillation ([place])   . Cirrhosis of liver ([place])   . Diabetes mellitus without complication ([place])   . Dysrhythmia   . Liver disease     Patient Active Problem List   Diagnosis Date Noted  . Hypertension 10/24/2020  . 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 Anxiety 07/17/2020  . Depression 05/24/2020  . Gastroesophageal reflux disease 05/24/2020  . Hypomagnesemia 11/28/2019  . Hyperkalemia 05/16/2016  . Abnormal EKG 05/16/2016  . PAF (paroxysmal atrial fibrillation) ([place]) 05/16/2016  . Chest pain 05/16/2016  . Benign essential HTN 05/14/2016  . Mixed hyperlipidemia 04/09/2015  . 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  . 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 Chronic kidney disease (CKD), stage III (moderate) ([place]) 03/08/2013  . Adrenal abnormality ( [place])  01/20/2013  . 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 CARDIAC ELECTROPHYSIOLOGY STUDY AND ABLATION  08/25/2017  . HERNIA REPAIR    . Liver Transplant Surgery         Home Medications    Prior to Admission medications   Medication Sig Start Date End Date Taking? Authorizing Provider  amLODipine (NORVASC) 5 MG tablet Take 1 tablet by mouth daily. 05/14/16  Yes [provider]  apixaban (ELIQUIS) 5 MG TABS tablet Take 5 mg by mouth 2 (two) times daily.   Yes [provider]  Calcium Carb-Cholecalciferol 600-400 MG-UNIT TABS Take by mouth. 08/02/13  Yes [provider]  Cholecalciferol 25 MCG (1000 UT) tablet Take 3 tablets by mouth daily. 04/26/20  Yes [provider]  Cyanocobalamin (VITAMIN B-12 PO) Take 1 tablet by mouth daily.   Yes [provider]  cycloSPORINE modified (NEORAL) 25 MG capsule Take 2 capsules by mouth 2 (two) times daily. 08/15/20  Yes [provider]  insulin detemir (LEVEMIR) 100 UNIT/ML injection Inject 18 Units into the skin at bedtime. 09/09/17  Yes [provider]  ipratropium (ATROVENT) 0.06 % nasal spray Place 2 sprays into both nostrils 4 (four) times daily. 10/24/20  Yes Margarette Canada, NP  magnesium oxide (MAG-OX) 400 (240 Mg) MG tablet Take 2 tablets by mouth. 07/02/16  Yes [provider]  MAGNESIUM OXIDE PO Take 1 tablet by mouth 2 (two) times  daily. Take 2 tablets at 1300 and 1 tablet at 1600   Yes [provider]  metoprolol tartrate (LOPRESSOR) 25 MG tablet Take 1 tablet (25 mg total) by mouth 2 (two) times daily. 06/06/20  Yes Cook, Jayce G, DO  Multiple Vitamins-Minerals (MULTIVITAMIN ADULT) CHEW Chew 1 tablet by mouth daily.   Yes [provider]  mycophenolate (CELLCEPT) 250 MG capsule Take 250 mg by mouth 2 (two) times daily.   Yes [provider]  omeprazole (PRILOSEC) 20 MG capsule Take 20 mg by mouth  daily.   Yes [provider]  pravastatin (PRAVACHOL) 40 MG tablet Take 0.5 tablets by mouth. 07/15/20  Yes [provider]  predniSONE (DELTASONE) 20 MG tablet Take 1 tablet (20 mg total) by mouth daily for 10 days. 11/11/20 11/21/20 Yes Danton Clap, PA-C  venlafaxine (EFFEXOR) 37.5 MG tablet Take 75 mg by mouth daily.    Yes [provider]  benzonatate (TESSALON) 100 MG capsule Take 2 capsules (200 mg total) by mouth every 8 (eight) hours. 10/24/20   Margarette Canada, NP    Family History Family History  Problem Relation Age of Onset  . Alcohol abuse Mother   . Emphysema Father     Social History Social History   Tobacco Use  . Smoking status: Never Smoker  . Smokeless tobacco: Never Used  Vaping Use  . Vaping Use: Never used  Substance Use Topics  . Alcohol use: No  . Drug use: No     Allergies   Ibuprofen   Review of Systems Review of Systems  Constitutional: Negative for fever.  Musculoskeletal: Positive for arthralgias and joint swelling.  Skin: Positive for color change. Negative for rash and wound.  Neurological: Negative for weakness and numbness.     Physical Exam Triage Vital Signs ED Triage Vitals  Enc Vitals Group     BP 11/11/20 0943 (!) 131/91     Pulse Rate 11/11/20 0943 80     Resp 11/11/20 0943 18     Temp 11/11/20 0943 98.4 F (36.9 C)     Temp Source 11/11/20 0943 Oral     SpO2 11/11/20 0943 100 %     Weight 11/11/20 0941 200 lb (90.7 kg)     Height 11/11/20 0941 5' 7.75" (1.721 m)     Head Circumference --      Peak Flow --      Pain Score 11/11/20 0941 5     Pain Loc --      Pain Edu? --      Excl. in [place]? --    No data found.  Updated Vital Signs BP (!) 131/91 (BP Location: Left Arm)   Pulse 80   Temp 98.4 F (36.9 C) (Oral)   Resp 18   Ht 5' 7.75" (1.721 m)   Wt 200 lb (90.7 kg)   SpO2 100%   BMI 30.63 kg/m      Physical Exam Vitals and nursing note reviewed.  Constitutional:      General: He  is not in acute distress.    Appearance: Normal appearance. He is well-developed. He is not ill-appearing.  HENT:     Head: Normocephalic and atraumatic.  Eyes:     General: No scleral icterus.    Conjunctiva/sclera: Conjunctivae normal.  Cardiovascular:     Rate and Rhythm: Normal rate and regular rhythm.     Pulses: Normal pulses.  Pulmonary:     Effort: Pulmonary effort is normal. No  respiratory distress.  Musculoskeletal:     Cervical back: Neck supple.     Comments: LEFT FOOT: There is mild swelling and erythema of the first MTP.  This area is tender.  Skin:    General: Skin is warm and dry.  Neurological:     General: No focal deficit present.     Mental Status: He is alert. Mental status is at baseline.     Motor: No weakness.     Gait: Gait abnormal.  Psychiatric:        Mood and Affect: Mood normal.        Behavior: Behavior normal.        Thought Content: Thought content normal.      UC Treatments / Results  Labs (all labs ordered are listed, but only abnormal results are displayed) Labs Reviewed - No data to display  EKG   Radiology No results found.  Procedures Procedures (including critical care time)  Medications Ordered in UC Medications - No data to display  Initial Impression / Assessment and Plan / UC Course  I have reviewed the triage vital signs and the nursing notes.  Pertinent labs & imaging results that were available during my care of the patient were reviewed by me and considered in my medical decision making (see chart for details).   80 year old male with history of gout, liver transplant diabetes presenting for acute flareup of his gout since yesterday.  Patient previously treated with prednisone for 10 days.  States this works for him well.  He says he cannot take higher doses of prednisone because it elevates his blood sugar.  He does not take a preventative for his gout because he says flareups are infrequent.  Treating him again with  prednisone 20 mg for 10 days.  Advised RICE and elevation.  Advised to follow-up with PCP if he has another flareup to consider preventative medication.   Final Clinical Impressions(s) / UC Diagnoses   Final diagnoses:  Acute gout of left foot, unspecified cause   Discharge Instructions   None    ED Prescriptions    Medication Sig Dispense Auth. Provider   predniSONE (DELTASONE) 20 MG tablet Take 1 tablet (20 mg total) by mouth daily for 10 days. 10 tablet Gretta Cool     PDMP not reviewed this encounter.   Danton Clap, PA-C 11/11/20 1029    Laurene Footman B, PA-C 11/11/20 1030

## 2020-11-11 NOTE — ED Triage Notes (Signed)
Patient states that he is having an attack in his left big toe that started last night. States that this happened earlier in the month and he was treated with prednisone.

## 2021-01-01 ENCOUNTER — Ambulatory Visit
Admission: RE | Admit: 2021-01-01 | Discharge: 2021-01-01 | Disposition: A | Discharge status: Home / Self Care | Payer: Medicare Other | Source: Ambulatory Visit | Attending: Sports Medicine | Admitting: Sports Medicine

## 2021-01-01 ENCOUNTER — Other Ambulatory Visit: Payer: Self-pay

## 2021-01-01 VITALS — BP 110/74 | HR 75 | Temp 98.9°F | Resp 16 | Wt 200.0 lb

## 2021-01-01 DIAGNOSIS — D367 Benign neoplasm of other specified sites: Secondary | ICD-10-CM | POA: Diagnosis not present

## 2021-01-01 NOTE — ED Triage Notes (Signed)
Pt states he noticed a bump under his skin on his right calf last night

## 2021-01-01 NOTE — Discharge Instructions (Addendum)
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  If it changes in size or consistency or becomes painful then please contact Dr. Reuel Boom office.  Your test for a blood clot are very reassuring.  You are also on Eliquis.  If your symptoms were to worsen then please go to the emergency room.

## 2021-01-01 NOTE — ED Provider Notes (Signed)
[facility]    CSN: [id number] Arrival date & time: 01/01/21  1346      History   Chief Complaint Chief Complaint  Patient presents with   Mass    HPI [name] is a 80 y.o. male.   Pleasant 80 year old male who presents for evaluation of a tiny lump under skin in the calf of his right leg.  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  He is on Eliquis.  He says is not overly tender.  There is no redness or warmth.  He has no calf tenderness.  It is freely mobile.  He noticed it when he was scratching his calf last evening and felt it was a good idea to get it checked.  Only sees Dr. Lovie Macadamia at [facility] in [place] but could not get an appointment today.  No chest pain shortness of breath.  No red flag signs or symptoms.  No fever shakes chills.  No redness to the calf.   Past Medical History:  Diagnosis Date   Atrial fibrillation ([place])    Cirrhosis of liver ([place])    Diabetes mellitus without complication ([place])    Dysrhythmia    Liver disease     Patient Active Problem List   Diagnosis Date Noted   Hypertension 10/24/2020   Liver transplanted ([place]) 10/24/2020   Anxiety 07/17/2020   Depression 05/24/2020   Gastroesophageal reflux disease 05/24/2020   Hypomagnesemia 11/28/2019   Hyperkalemia 05/16/2016   Abnormal EKG 05/16/2016   PAF (paroxysmal atrial fibrillation) ([place]) 05/16/2016   Chest pain 05/16/2016   Benign essential HTN 05/14/2016   Mixed hyperlipidemia 04/09/2015   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   DDD (degenerative disc disease), 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 Authorizing Provider  amLODipine (NORVASC) 5 MG tablet Take 1 tablet by mouth daily. 05/14/16  Yes [provider]  apixaban (ELIQUIS) 5 MG TABS tablet Take 5 mg by mouth 2 (two) times daily.   Yes [provider]  benzonatate (TESSALON) 100 MG capsule Take 2 capsules (200 mg total) by mouth every 8 (eight) hours. 10/24/20  Yes Margarette Canada, NP  Calcium Carb-Cholecalciferol 600-400 MG-UNIT TABS Take by mouth. 08/02/13  Yes [provider]  Cholecalciferol 25 MCG (1000 UT) tablet Take 3 tablets by mouth daily. 04/26/20  Yes [provider]  Cyanocobalamin (VITAMIN B-12 PO) Take 1 tablet by mouth daily.   Yes [provider]  cycloSPORINE modified (NEORAL) 25 MG capsule Take 2 capsules by mouth 2 (two) times daily. 08/15/20  Yes [provider]  insulin detemir (LEVEMIR) 100 UNIT/ML injection Inject 18 Units into the skin at bedtime. 09/09/17  Yes [provider]  ipratropium (ATROVENT) 0.06 % nasal spray Place 2 sprays into both nostrils  4 (four) times daily. 10/24/20  Yes Margarette Canada, NP  magnesium oxide (MAG-OX) 400 (240 Mg) MG tablet Take 2 tablets by mouth. 07/02/16  Yes [provider]  MAGNESIUM OXIDE PO Take 1 tablet by mouth 2 (two) times daily. Take 2 tablets at 1300 and 1 tablet at 1600   Yes [provider]  metoprolol tartrate (LOPRESSOR) 25 MG tablet Take 1 tablet (25 mg total) by mouth 2 (two) times daily. 06/06/20  Yes Cook, Jayce G, DO  Multiple Vitamins-Minerals (MULTIVITAMIN ADULT) CHEW Chew 1 tablet by mouth daily.   Yes [provider]  mycophenolate (CELLCEPT) 250 MG capsule Take 250 mg by  mouth 2 (two) times daily.   Yes [provider]  omeprazole (PRILOSEC) 20 MG capsule Take 20 mg by mouth daily.   Yes [provider]  pravastatin (PRAVACHOL) 40 MG tablet Take 0.5 tablets by mouth. 07/15/20  Yes [provider]  venlafaxine (EFFEXOR) 37.5 MG tablet Take 75 mg by mouth daily.    Yes [provider]    Family History Family History  Problem Relation Age of Onset   Alcohol abuse Mother    Emphysema Father     Social History Social History   Tobacco Use   Smoking status: Never   Smokeless tobacco: Never  Vaping Use   Vaping Use: Never used  Substance Use Topics   Alcohol use: No   Drug use: No     Allergies   Ibuprofen   Review of Systems Review of Systems  Constitutional:  Negative for activity change, appetite change, chills, diaphoresis, fatigue and fever.  HENT:  Negative for congestion, ear pain, postnasal drip, rhinorrhea, sinus pressure, sinus pain, sneezing and sore throat.   Eyes:  Negative for pain.  Respiratory:  Negative for cough, chest tightness and shortness of breath.   Cardiovascular:  Negative for chest pain and palpitations.  Gastrointestinal:  Negative for abdominal pain, diarrhea, nausea and vomiting.  Genitourinary:  Negative for dysuria.  Musculoskeletal:  Negative for back pain, myalgias and neck pain.  Skin:  Negative for color change, pallor, rash and wound.  Neurological:  Negative for dizziness, light-headedness and headaches.  All other systems reviewed and are negative.   Physical Exam Triage Vital Signs ED Triage Vitals [01/01/21 1354]  Enc Vitals Group     BP 110/74     Pulse Rate 75     Resp 16     Temp 98.9 F (37.2 C)     Temp Source Oral     SpO2 97 %     Weight 200 lb (90.7 kg)     Height      Head Circumference      Peak Flow      Pain Score 0     Pain Loc      Pain Edu?      Excl. in [place]?    No data found.  Updated Vital Signs BP 110/74 (BP Location: Right Arm)    Pulse 75   Temp 98.9 F (37.2 C) (Oral)   Resp 16   Wt 90.7 kg   SpO2 97%   BMI 30.63 kg/m   Visual Acuity Right Eye Distance:   Left Eye Distance:   Bilateral Distance:    Right Eye Near:   Left Eye Near:    Bilateral Near:     Physical Exam Vitals and nursing note reviewed.  Constitutional:      General: He is not in acute distress.  Appearance: Normal appearance. He is not ill-appearing, toxic-appearing or diaphoretic.  HENT:     Head: Normocephalic and atraumatic.     Nose: Nose normal.     Mouth/Throat:     Mouth: Mucous membranes are moist.  Eyes:     Conjunctiva/sclera: Conjunctivae normal.     Pupils: Pupils are equal, round, and reactive to light.  Cardiovascular:     Rate and Rhythm: Normal rate. Rhythm irregular.     Pulses: Normal pulses.     Heart sounds: Normal heart sounds. No murmur heard.   No friction rub. No gallop.  Pulmonary:     Effort: Pulmonary effort is normal.     Breath sounds: Normal breath sounds. No stridor. No wheezing, rhonchi or rales.  Musculoskeletal:     Cervical back: Normal range of motion and neck supple.     Right lower leg: No swelling. No edema.     Left lower leg: No swelling. No edema.     Comments: Tiny freely mobile lesion just under the skin consistent with a probable dermoid cyst.  Is not painful.  There is no redness or warmth over the overlying skin.  Homans and [place] test are negative.  Skin:    General: Skin is warm and dry.     Capillary Refill: Capillary refill takes less than 2 seconds.     Coloration: Skin is not jaundiced or pale.     Findings: No bruising, erythema, lesion or rash.  Neurological:     General: No focal deficit present.     Mental Status: He is alert and oriented to person, place, and time.     UC Treatments / Results  Labs (all labs ordered are listed, but only abnormal results are displayed) Labs Reviewed - No data to display  EKG   Radiology No results  found.  Procedures Procedures (including critical care time)  Medications Ordered in UC Medications - No data to display  Initial Impression / Assessment and Plan / UC Course  I have reviewed the triage vital signs and the nursing notes.  Pertinent labs & imaging results that were available during my care of the patient were reviewed by me and considered in my medical decision making (see chart for details).  Clinical impression: Tiny freely mobile lesion just under the skin in the right calf.  No evidence of a DVT.  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  Treatment plan: 1.  The findings and treatment plan were discussed in detail with the patient.  Patient was in agreement. 2.  A long discussion with him about what I was concerned about and what the findings indicated.  All questions were encouraged and answered. 3.  Asked him to follow-up with his primary care provider if the size or consistency changed or if he developed any pain.  He voiced verbal understanding. 4.  If his symptoms were to worsen in any way then he should go to the ER. 5.  He was discharged in stable condition and will follow-up here as needed.    Final Clinical Impressions(s) / UC Diagnoses   Final diagnoses:  Cyst, dermoid, leg, right     Discharge Instructions      As we discussed, your tiny lesion under the skin is consistent with a cyst, probably a dermoid cyst which is a collection of dead skin tissue.  If it changes in size or consistency or becomes painful then please contact Dr. Reuel Boom office.  Your test for a  blood clot are very reassuring.  You are also on Eliquis.  If your symptoms were to worsen then please go to the emergency room.     ED Prescriptions   None    PDMP not reviewed this encounter.   Verda Cumins, MD 01/01/21 [phone]

## 2021-03-05 ENCOUNTER — Other Ambulatory Visit: Payer: Self-pay

## 2021-03-05 ENCOUNTER — Ambulatory Visit
Admission: EM | Admit: 2021-03-05 | Discharge: 2021-03-05 | Disposition: A | Discharge status: Home / Self Care | Payer: Medicare Other | Attending: Family Medicine | Admitting: Family Medicine

## 2021-03-05 DIAGNOSIS — M109 Gout, unspecified: Secondary | ICD-10-CM | POA: Diagnosis not present

## 2021-03-05 MED ORDER — PREDNISONE 10 MG PO TABS: ORAL_TABLET | ORAL | 0 refills | Status: DC

## 2021-03-05 NOTE — ED Triage Notes (Signed)
Pt c/o left great toe pain for several days. Pt denies any known injury. Pt does have history of gout.

## 2021-03-05 NOTE — Discharge Instructions (Signed)
Watch sugars closely and increase insulin if needed.  Follow up with your physicians.  Take care  Dr. Lacinda Axon

## 2021-03-06 ENCOUNTER — Ambulatory Visit: Payer: Self-pay

## 2021-03-07 NOTE — ED Provider Notes (Signed)
[facility]    CSN: [id number] Arrival date & time: 03/05/21  1729      History   Chief Complaint Chief Complaint  Patient presents with   Toe Pain    L great    HPI 80 year old male presents with the above complaint.  Patient reports that he has had left great toe pain, swelling, warmth for the past few days.  His pain is currently 4/10 in severity.  With ambulation and with pressure or palpation his pain is much more severe.  He has had no fever.  No known inciting factor.  No fall, trauma, injury.  Patient does have a history of gout.  He was recently started on allopurinol given his history of gout and elevated uric acid of 8.5.  Past Medical History:  Diagnosis Date   Atrial fibrillation ([place])    Cirrhosis of liver ([place])    Diabetes mellitus without complication ([place])    Dysrhythmia    Liver disease     Patient Active Problem List   Diagnosis Date Noted   Hypertension 10/24/2020   Liver transplanted ([place]) 10/24/2020   Anxiety 07/17/2020   Depression 05/24/2020   Gastroesophageal reflux disease 05/24/2020   Hypomagnesemia 11/28/2019   Hyperkalemia 05/16/2016   Abnormal EKG 05/16/2016   PAF (paroxysmal atrial fibrillation) ([place]) 05/16/2016   Chest pain 05/16/2016   Benign essential HTN 05/14/2016   Mixed hyperlipidemia 04/09/2015   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   DDD (degenerative disc disease), 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 Authorizing Provider  allopurinol (ZYLOPRIM) 100 MG tablet Take by mouth. 01/30/21 01/30/22 Yes [provider]  amLODipine (NORVASC) 5 MG tablet Take by mouth. 10/01/20  Yes [provider]  apixaban (ELIQUIS) 5 MG TABS tablet Take 5 mg by mouth 2 (two) times daily.   Yes [provider]  Calcium Carb-Cholecalciferol 600-400 MG-UNIT TABS Take by mouth. 08/02/13  Yes [provider]  Cholecalciferol 25 MCG (1000 UT) tablet Take 3 tablets by mouth daily. 04/26/20  Yes [provider]  Cyanocobalamin (VITAMIN B-12 PO) Take 1 tablet by mouth daily.   Yes [provider]  cycloSPORINE modified (NEORAL) 25 MG capsule Take 2 capsules by mouth 2 (two) times daily. 08/15/20  Yes [provider]  insulin detemir (LEVEMIR) 100 UNIT/ML injection Inject 18 Units into the skin at bedtime. 09/09/17  Yes [provider]  ipratropium (ATROVENT) 0.06 % nasal spray Place 2 sprays into both nostrils 4 (four) times daily. 10/24/20  Yes Margarette Canada, NP  magnesium oxide (MAG-OX) 400 (240 Mg) MG tablet Take 2 tablets by mouth. 07/02/16  Yes [provider]  MAGNESIUM OXIDE PO Take 1 tablet by mouth 2 (two) times daily. Take 2 tablets at 1300 and 1 tablet at 1600   Yes [provider]  metoprolol tartrate (LOPRESSOR) 25 MG tablet Take 1 tablet (25 mg total) by mouth 2 (two) times daily. 06/06/20  Yes Lacinda Axon,  [place] Constantin G, DO  Multiple Vitamins-Minerals (MULTIVITAMIN ADULT) CHEW Chew 1 tablet by mouth daily.   Yes [provider]  mycophenolate (CELLCEPT) 250 MG capsule Take 250 mg by mouth 2 (two) times daily.   Yes [provider]  omeprazole (PRILOSEC) 20 MG capsule Take 20 mg by mouth daily.   Yes [provider]  pravastatin (PRAVACHOL) 40 MG tablet Take 0.5 tablets by mouth. 07/15/20  Yes [provider]  predniSONE (DELTASONE) 10 MG tablet 50 mg daily x 2 days, then 40 mg daily x 2  days, then 30 mg daily x 2 days, then 20 mg daily x 2 days, then 10 mg daily x 2 days. 03/05/21  Yes Anari Evitt G, DO  venlafaxine (EFFEXOR) 37.5 MG tablet Take 75 mg by mouth daily.    Yes [provider]    Family History Family History  Problem Relation Age of Onset   Alcohol abuse Mother    Emphysema Father     Social History Social History   Tobacco Use   Smoking status: Never   Smokeless tobacco: Never  Vaping Use   Vaping Use: Never used  Substance Use Topics   Alcohol use: No   Drug use: No     Allergies   Ibuprofen   Review of Systems Review of Systems Per HPI  Physical Exam Triage Vital Signs ED Triage Vitals  Enc Vitals Group     BP 03/05/21 1811 131/90     Pulse Rate 03/05/21 1811 74     Resp 03/05/21 1811 18     Temp 03/05/21 1811 98.2 F (36.8 C)     Temp Source 03/05/21 1811 Oral     SpO2 03/05/21 1811 98 %     Weight 03/05/21 1809 200 lb (90.7 kg)     Height 03/05/21 1809 5\' 8"  (1.727 m)     Head Circumference --      Peak Flow --      Pain Score 03/05/21 1809 4     Pain Loc --      Pain Edu? --      Excl. in [place]? --    Updated Vital Signs BP 131/90 (BP Location: Left Arm)   Pulse 74   Temp 98.2 F (36.8 C) (Oral)   Resp 18   Ht 5\' 8"  (1.727 m)   Wt 90.7 kg   SpO2 98%   BMI 30.41 kg/m   Visual Acuity Right Eye Distance:   Left Eye Distance:   Bilateral Distance:    Right Eye Near:   Left Eye Near:    Bilateral Near:     Physical Exam Vitals and nursing note reviewed.  Constitutional:      General: He is not in acute distress.    Appearance: Normal appearance. He is not ill-appearing.  HENT:     Head: Normocephalic and atraumatic.  Eyes:     General:        Right eye: No discharge.        Left eye: No discharge.     Conjunctiva/sclera: Conjunctivae normal.  Cardiovascular:     Rate and Rhythm: Normal rate and regular rhythm.  Pulmonary:     Effort: Pulmonary effort is normal. No respiratory distress.   Musculoskeletal:     Comments: Left great toe -swelling, erythema, and warmth at the MTP joint.  Exquisitely tender to palpation.  Neurological:     Mental Status: He is alert.  Psychiatric:  Mood and Affect: Mood normal.        Behavior: Behavior normal.     UC Treatments / Results  Labs (all labs ordered are listed, but only abnormal results are displayed) Labs Reviewed - No data to display  EKG   Radiology No results found.  Procedures Procedures (including critical care time)  Medications Ordered in UC Medications - No data to display  Initial Impression / Assessment and Plan / UC Course  I have reviewed the triage vital signs and the nursing notes.  Pertinent labs & imaging results that were available during my care of the patient were reviewed by me and considered in my medical decision making (see chart for details).    80 year old male presents with gout.  Patient was recently started on allopurinol.  This could be a possible trigger for his gout flare.  He was not placed on colchicine or other therapy when he was started on the allopurinol.  I spoke with pharmacy regarding use of colchicine in the setting of patient's immunosuppressive therapies.  Colchicine interacts with the cyclosporine.  Therefore, will proceed with course of prednisone and lieu of the colchicine.  Patient is to follow-up with his primary care physician and/or his hepatologist.  Final Clinical Impressions(s) / UC Diagnoses   Final diagnoses:  Acute gout involving toe of left foot, unspecified cause     Discharge Instructions      Watch sugars closely and increase insulin if needed.  Follow up with your physicians.  Take care  Dr. Lacinda Axon    ED Prescriptions     Medication Sig Dispense Auth. Provider   predniSONE (DELTASONE) 10 MG tablet 50 mg daily x 2 days, then 40 mg daily x 2 days, then 30 mg daily x 2 days, then 20 mg daily x 2 days, then 10 mg daily x 2 days. 30 tablet  Coral Spikes, DO      PDMP not reviewed this encounter.   Coral Spikes, [place] 03/07/21 [phone]

## 2021-08-16 ENCOUNTER — Encounter: Payer: Self-pay | Admitting: Emergency Medicine

## 2021-08-16 ENCOUNTER — Ambulatory Visit
Admission: EM | Admit: 2021-08-16 | Discharge: 2021-08-16 | Disposition: A | Discharge status: Home / Self Care | Payer: Medicare Other | Attending: Physician Assistant | Admitting: Physician Assistant

## 2021-08-16 ENCOUNTER — Other Ambulatory Visit: Payer: Self-pay

## 2021-08-16 DIAGNOSIS — E1122 Type 2 diabetes mellitus with diabetic chronic kidney disease: Secondary | ICD-10-CM | POA: Insufficient documentation

## 2021-08-16 DIAGNOSIS — R509 Fever, unspecified: Secondary | ICD-10-CM | POA: Diagnosis not present

## 2021-08-16 DIAGNOSIS — B349 Viral infection, unspecified: Secondary | ICD-10-CM

## 2021-08-16 DIAGNOSIS — I480 Paroxysmal atrial fibrillation: Secondary | ICD-10-CM | POA: Diagnosis not present

## 2021-08-16 DIAGNOSIS — Z944 Liver transplant status: Secondary | ICD-10-CM | POA: Diagnosis not present

## 2021-08-16 DIAGNOSIS — Z20822 Contact with and (suspected) exposure to covid-19: Secondary | ICD-10-CM | POA: Insufficient documentation

## 2021-08-16 DIAGNOSIS — R5383 Other fatigue: Secondary | ICD-10-CM | POA: Insufficient documentation

## 2021-08-16 LAB — RESP PANEL BY RT-PCR (FLU A&B, COVID) ARPGX2
Influenza A by PCR: NEGATIVE
Influenza B by PCR: NEGATIVE
SARS Coronavirus 2 by RT PCR: NEGATIVE

## 2021-08-16 MED ORDER — ONDANSETRON HCL 4 MG PO TABS: 4.0000 mg | ORAL_TABLET | Freq: Four times a day (QID) | ORAL | 0 refills | Status: DC

## 2021-08-16 NOTE — ED Provider Notes (Signed)
[facility]    CSN: [id number] Arrival date & time: 08/16/21  0954      History   Chief Complaint Chief Complaint  Patient presents with   Fatigue   Fever    HPI [name] is a 81 y.o. male.   Presents with fever, fatigue and in the easiness to the stomach with soft stools for 1 day.  Tolerating food and liquids.  No known sick contacts.  Has not attempted treatment of symptoms.  History of atrial fibrillation, cirrhosis with transplant, diabetes mellitus.  Denies nasal congestion, rhinorrhea, sore throat, cough, shortness of breath, wheezing, vomiting, ear pain or headaches.    Past Medical History:  Diagnosis Date   Atrial fibrillation ([place])    Cirrhosis of liver ([place])    Diabetes mellitus without complication ([place])    Dysrhythmia    Liver disease     Patient Active Problem List   Diagnosis Date Noted   Hypertension 10/24/2020   Liver transplanted ([place]) 10/24/2020   Anxiety 07/17/2020   Depression 05/24/2020   Gastroesophageal reflux disease 05/24/2020   Hypomagnesemia 11/28/2019   Hyperkalemia 05/16/2016   Abnormal EKG 05/16/2016   PAF (paroxysmal atrial fibrillation) ([place]) 05/16/2016   Chest pain 05/16/2016   Benign essential HTN 05/14/2016   Mixed hyperlipidemia 04/09/2015   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   DDD (degenerative disc disease), 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  Authorizing Provider  allopurinol (ZYLOPRIM) 100 MG tablet Take by mouth. 01/30/21 01/30/22 Yes [provider]  amLODipine (NORVASC) 5 MG tablet Take by mouth. 10/01/20  Yes [provider]  apixaban (ELIQUIS) 5 MG TABS tablet Take 5 mg by mouth 2 (two) times daily.   Yes [provider]  Calcium Carb-Cholecalciferol 600-400 MG-UNIT TABS Take by mouth. 08/02/13  Yes [provider]  Cholecalciferol 25 MCG (1000 UT) tablet Take 3 tablets by mouth daily. 04/26/20  Yes [provider]  Cyanocobalamin (VITAMIN B-12 PO) Take 1 tablet by mouth daily.   Yes [provider]  cycloSPORINE modified (NEORAL) 25 MG capsule Take 2 capsules by mouth 2 (two) times daily. 08/15/20  Yes [provider]  insulin detemir (LEVEMIR) 100 UNIT/ML injection Inject 18 Units into the skin at bedtime. 09/09/17  Yes [provider]  metoprolol tartrate (LOPRESSOR) 25 MG tablet Take 1 tablet (25 mg total) by mouth 2 (two) times daily. 06/06/20  Yes Cook, Jayce G, DO  Multiple Vitamins-Minerals (MULTIVITAMIN ADULT) CHEW Chew 1 tablet by mouth daily.   Yes [provider]  mycophenolate (CELLCEPT) 250 MG capsule Take 250 mg by mouth 2 (two) times daily.   Yes [provider]  omeprazole (PRILOSEC) 20 MG capsule Take 20 mg by mouth daily.   Yes [provider]  pravastatin (PRAVACHOL) 40 MG tablet Take 0.5 tablets by mouth. 07/15/20  Yes [provider]  predniSONE (DELTASONE) 5 MG tablet Take 5 mg  by mouth daily with breakfast.   Yes [provider]  venlafaxine (EFFEXOR) 37.5 MG tablet Take 75 mg by mouth daily.    Yes [provider]  ipratropium (ATROVENT) 0.06 % nasal spray Place 2 sprays into both nostrils 4 (four) times daily. 10/24/20   Margarette Canada, NP  magnesium oxide (MAG-OX) 400 (240 Mg) MG tablet Take 2 tablets by mouth. 07/02/16   [provider]  MAGNESIUM OXIDE PO Take 1 tablet by mouth 2  (two) times daily. Take 2 tablets at 1300 and 1 tablet at 1600    [provider]  predniSONE (DELTASONE) 10 MG tablet 50 mg daily x 2 days, then 40 mg daily x 2 days, then 30 mg daily x 2 days, then 20 mg daily x 2 days, then 10 mg daily x 2 days. 03/05/21   Coral Spikes, DO    Family History Family History  Problem Relation Age of Onset   Alcohol abuse Mother    Emphysema Father     Social History Social History   Tobacco Use   Smoking status: Never   Smokeless tobacco: Never  Vaping Use   Vaping Use: Never used  Substance Use Topics   Alcohol use: No   Drug use: No     Allergies   Ibuprofen   Review of Systems Review of Systems  Constitutional:  Positive for fatigue and fever. Negative for activity change, appetite change, chills, diaphoresis and unexpected weight change.  HENT: Negative.    Respiratory: Negative.    Cardiovascular: Negative.   Gastrointestinal: Negative.   Skin: Negative.   Neurological: Negative.     Physical Exam Triage Vital Signs ED Triage Vitals  Enc Vitals Group     BP 08/16/21 1005 120/81     Pulse Rate 08/16/21 1005 75     Resp 08/16/21 1005 15     Temp 08/16/21 1005 98.4 F (36.9 C)     Temp Source 08/16/21 1005 Oral     SpO2 08/16/21 1005 97 %     Weight 08/16/21 1000 200 lb (90.7 kg)     Height 08/16/21 1000 '5\' 8"'$  (1.727 m)     Head Circumference --      Peak Flow --      Pain Score 08/16/21 1000 0     Pain Loc --      Pain Edu? --      Excl. in [place]? --    No data found.  Updated Vital Signs BP 120/81 (BP Location: Left Arm)    Pulse 75    Temp 98.4 F (36.9 C) (Oral)    Resp 15    Ht '5\' 8"'$  (1.727 m)    Wt 200 lb (90.7 kg)    SpO2 97%    BMI 30.41 kg/m   Visual Acuity Right Eye Distance:   Left Eye Distance:   Bilateral Distance:    Right Eye Near:   Left Eye Near:    Bilateral Near:     Physical Exam Constitutional:      Appearance: Normal appearance.  HENT:     Head: Normocephalic.     Right  Ear: Hearing, ear canal and external ear normal. A middle ear effusion is present.     Left Ear: Hearing, ear canal and external ear normal. A middle ear effusion is present.     Nose: Congestion present. No rhinorrhea.     Mouth/Throat:     Mouth: Mucous membranes are moist.  Pharynx: Posterior oropharyngeal erythema present.  Eyes:     Extraocular Movements: Extraocular movements intact.  Cardiovascular:     Rate and Rhythm: Normal rate and regular rhythm.     Pulses: Normal pulses.     Heart sounds: Normal heart sounds.  Pulmonary:     Effort: Pulmonary effort is normal.     Breath sounds: Normal breath sounds.  Musculoskeletal:     Cervical back: Normal range of motion and neck supple.  Skin:    General: Skin is warm and dry.  Neurological:     Mental Status: He is alert and oriented to person, place, and time. Mental status is at baseline.  Psychiatric:        Mood and Affect: Mood normal.        Behavior: Behavior normal.     UC Treatments / Results  Labs (all labs ordered are listed, but only abnormal results are displayed) Labs Reviewed  RESP PANEL BY RT-PCR (FLU A&B, COVID) [id number]    EKG   Radiology No results found.  Procedures Procedures (including critical care time)  Medications Ordered in UC Medications - No data to display  Initial Impression / Assessment and Plan / UC Course  I have reviewed the triage vital signs and the nursing notes.  Pertinent labs & imaging results that were available during my care of the patient were reviewed by me and considered in my medical decision making (see chart for details).  Viral illness  Vital signs are stable, O2 saturation 97% and lungs are clear to auscultation, patient in no signs of distress, COVID and flu testing negative, discussed findings with patient and daughter, etiology of symptoms are most likely viral, Zofran prescribed for management of nausea and stomach uneasiness, over-the-counter  medications for remaining symptom support, urgent care follow-up as needed Final Clinical Impressions(s) / UC Diagnoses   Final diagnoses:  None   Discharge Instructions   None    ED Prescriptions   None    PDMP not reviewed this encounter.   Hans Eden, NP 08/16/21 1053

## 2021-08-16 NOTE — Discharge Instructions (Signed)
Your symptoms today are most likely being caused by a virus and should steadily improve in time it can take up to 7 to 10 days before you truly start to see a turnaround however things will get better ? ?COVID and flu testing negative ? ?You may use Zofran every 6 hours as needed to help calm nausea, wait 30 minutes to an hour before attempting to eat after use of medication ?   ?You can take Tylenol and/or Ibuprofen as needed for fever reduction and pain relief. ?  ?For cough: honey 1/2 to 1 teaspoon (you can dilute the honey in water or another fluid).  You can also use guaifenesin and dextromethorphan for cough. You can use a humidifier for chest congestion and cough.  If you don't have a humidifier, you can sit in the bathroom with the hot shower running.    ?  ?For sore throat: try warm salt water gargles, cepacol lozenges, throat spray, warm tea or water with lemon/honey, popsicles or ice, or OTC cold relief medicine for throat discomfort. ?  ?For congestion: take a daily anti-histamine like Zyrtec, Claritin, and a oral decongestant, such as pseudoephedrine.  You can also use Flonase 1-2 sprays in each nostril daily. ?  ?It is important to stay hydrated: drink plenty of fluids (water, gatorade/powerade/pedialyte, juices, or teas) to keep your throat moisturized and help further relieve irritation/discomfort.  ?

## 2021-08-16 NOTE — ED Triage Notes (Signed)
Patient c/o fatigue, upset stomach and low grade fever that started yesterday.  Patient denies cough or bodyaches.  ?

## 2022-02-28 ENCOUNTER — Ambulatory Visit
Admission: RE | Admit: 2022-02-28 | Discharge: 2022-02-28 | Disposition: A | Discharge status: Home / Self Care | Payer: Medicare Other | Source: Ambulatory Visit | Attending: Emergency Medicine | Admitting: Emergency Medicine

## 2022-02-28 ENCOUNTER — Ambulatory Visit (INDEPENDENT_AMBULATORY_CARE_PROVIDER_SITE_OTHER): Payer: Medicare Other

## 2022-02-28 VITALS — BP 117/80 | HR 73 | Temp 98.3°F | Resp 15 | Ht 68.0 in | Wt 200.0 lb

## 2022-02-28 DIAGNOSIS — M79642 Pain in left hand: Secondary | ICD-10-CM

## 2022-02-28 NOTE — Discharge Instructions (Addendum)
You were seen for left hand pain and are being treated for the same.   -No fractures or acute injury noted to your hand/knuckle. -Treat your pain with the amount of Tylenol recommended by your liver transplant doctor. -If pain does not improve with Tylenol and time, follow-up with your primary care provider or orthopedic specialist.  Further imaging may be needed.  Take care, Dr. Marland Kitchen, NP-c

## 2022-02-28 NOTE — ED Triage Notes (Signed)
Patient has redness, pain and swelling over the knuckle in his left 2nd finger and hand for a week.  Patient denies injury or fall.

## 2022-02-28 NOTE — ED Provider Notes (Signed)
[facility] - [place], [place]   Name: [name] DOB: 06-05-1941 MRN: [id number] CSN: [id number] PCP: Juluis Pitch, MD  Arrival date and time:  02/28/22 1149  Chief Complaint:  Hand Pain (APPOINTMENT Left 2nd finger/hand)   NOTE: Prior to seeing the patient today, I have reviewed the triage nursing documentation and vital signs. Clinical staff has updated patient's PMH/PSHx, current medication list, and drug allergies/intolerances to ensure comprehensive history available to assist in medical decision making.   History:   HPI: [name] is a 81 y.o. male who presents today with complaints of left knuckle pain x1 week.  Patient states he noticed increased pain to the first knuckle of his left hand 1 week ago.  He does not remember any trauma or injury to the area.  No known previous trauma or injury to his hand or wrist.  He denies fevers chills or body aches.  He has noticed decreased range of motion to his first finger due to the pain.   Past Medical History:  Diagnosis Date   Atrial fibrillation ([place])    Cirrhosis of liver ([place])    Diabetes mellitus without complication ([place])    Dysrhythmia    Liver disease     Past Surgical History:  Procedure Laterality Date   CARDIAC ELECTROPHYSIOLOGY STUDY AND ABLATION  08/25/2017   EYE SURGERY     HERNIA REPAIR     Liver Transplant Surgery      Family History  Problem Relation Age of Onset   Alcohol abuse Mother    Emphysema Father     Social History   Tobacco Use   Smoking status: Never   Smokeless tobacco: Never  Vaping Use   Vaping Use: Never used  Substance Use Topics   Alcohol use: No   Drug use: No    Patient Active Problem List   Diagnosis Date Noted   Hypertension 10/24/2020   Liver transplanted ([place]) 10/24/2020   Anxiety 07/17/2020   Depression 05/24/2020   Gastroesophageal reflux disease 05/24/2020   Hypomagnesemia 11/28/2019   Hyperkalemia 05/16/2016   Abnormal EKG 05/16/2016    PAF (paroxysmal atrial fibrillation) ([place]) 05/16/2016   Chest pain 05/16/2016   Benign essential HTN 05/14/2016   Mixed hyperlipidemia 04/09/2015   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    Allergies:   Ibuprofen  Review of Systems (ROS): Review of Systems  Musculoskeletal:  Positive for arthralgias and joint swelling.  All other systems reviewed and are negative.    Vital Signs: Today's Vitals   02/28/22 1156 02/28/22 1157 02/28/22 1158  BP:   117/80  Pulse:   73  Resp:   15  Temp:   98.3 F (36.8 C)  TempSrc:   Oral  SpO2:   97%  Weight:  200 lb (90.7 kg)   Height:  '5\' 8"'$  (1.727 m)   PainSc: 5       Physical Exam: Physical Exam Vitals and nursing note reviewed.  Constitutional:      Appearance: Normal appearance.  Cardiovascular:  Rate and Rhythm: Normal rate and regular rhythm.     Pulses: Normal pulses.     Heart sounds: Murmur heard.     Systolic murmur is present with a grade of 5/6.  Pulmonary:     Effort: Pulmonary effort is normal.     Breath sounds: Normal breath sounds.  Musculoskeletal:     Left hand: Tenderness and bony tenderness present.     Right lower leg: No edema.     Left lower leg: No edema.     Comments: Tenderness to second MCP joint.  Erythema noted to the area, blanchable.  Increased tenderness with gripping motion.  Skin:    General: Skin is warm and dry.  Neurological:     General: No focal deficit present.     Mental Status: He is alert and  oriented to person, place, and time.  Psychiatric:        Mood and Affect: Mood normal.        Behavior: Behavior normal.      Urgent Care Treatments / Results:   LABS: PLEASE NOTE: all labs that were ordered this encounter are listed, however only abnormal results are displayed. Labs Reviewed - No data to display  EKG: -None  RADIOLOGY: DG Hand Complete Left  Result Date: 02/28/2022 CLINICAL DATA:  Pain and swelling EXAM: LEFT HAND - COMPLETE 3+ VIEW COMPARISON:  None Available. FINDINGS: No recent fracture or dislocation is seen. There is deformity in the distal radius without break in the cortical margins suggesting old healed fracture. Small bony spurs are seen in first carpometacarpal joint. IMPRESSION: No recent fracture or dislocation is seen in left hand. Electronically Signed   By: Elmer Picker M.D.   On: 02/28/2022 12:36    PROCEDURES: Procedures  MEDICATIONS RECEIVED THIS VISIT: Medications - No data to display  PERTINENT CLINICAL COURSE NOTES/UPDATES:   Initial Impression / Assessment and Plan / Urgent Care Course:  Pertinent labs & imaging results that were available during my care of the patient were personally reviewed by me and considered in my medical decision making (see lab/imaging section of note for values and interpretations).  [name] is a 81 y.o. male who presents to [facility] today with complaints of pain to his left hand/knuckle, diagnosed with hand contusion, and treated as such with the directions below. NP and patient reviewed discharge instructions below during visit.   Patient is well appearing overall in clinic today. He does not appear to be in any acute distress. Presenting symptoms (see HPI) and exam as documented above.   I have reviewed the follow up and strict return precautions for any new or worsening symptoms. Patient is aware of symptoms that would be deemed urgent/emergent, and would thus require further evaluation  either here or in the emergency department. At the time of discharge, he verbalized understanding and consent with the discharge plan as it was reviewed with him. All questions were fielded by provider and/or clinic staff prior to patient discharge.    Final Clinical Impressions / Urgent Care Diagnoses:   Final diagnoses:  Pain of left hand    New Prescriptions:  [place] Controlled Substance Registry consulted? Not Applicable  No orders of the defined types were placed in this encounter.     Discharge Instructions      You were seen for left hand pain and are being treated for the same.   -No fractures or acute injury noted to your hand/knuckle. -Treat your pain with  the amount of Tylenol recommended by your liver transplant doctor. -If pain does not improve with Tylenol and time, follow-up with your primary care provider or orthopedic specialist.  Further imaging may be needed.  Take care, Dr. Marland Kitchen, NP-c      Recommended Follow up Care:  Patient encouraged to follow up with the following provider within the specified time frame, or sooner as dictated by the severity of his symptoms. As always, he was instructed that for any urgent/emergent care needs, he should seek care either here or in the emergency department for more immediate evaluation.   Gertie Baron, DNP, NP-c   Gertie Baron, NP 02/28/22 1315

## 2023-01-22 ENCOUNTER — Ambulatory Visit
Admission: RE | Admit: 2023-01-22 | Discharge: 2023-01-22 | Disposition: A | Discharge status: Home / Self Care | Payer: Medicare Other | Source: Ambulatory Visit | Attending: Emergency Medicine | Admitting: Emergency Medicine

## 2023-01-22 VITALS — BP 146/118 | HR 87 | Temp 98.6°F | Ht 68.0 in | Wt 190.0 lb

## 2023-01-22 DIAGNOSIS — N390 Urinary tract infection, site not specified: Secondary | ICD-10-CM

## 2023-01-22 LAB — URINALYSIS, W/ REFLEX TO CULTURE (INFECTION SUSPECTED)

## 2023-01-22 MED ORDER — PHENAZOPYRIDINE HCL 200 MG PO TABS: 200.0000 mg | ORAL_TABLET | Freq: Three times a day (TID) | ORAL | 0 refills | Status: DC

## 2023-01-22 MED ORDER — CEFDINIR 300 MG PO CAPS: 300.0000 mg | ORAL_CAPSULE | Freq: Two times a day (BID) | ORAL | 0 refills | Status: AC

## 2023-01-22 NOTE — Discharge Instructions (Signed)
Take the Cefdinir twice daily for 10 days with food for treatment of urinary tract infection.  Use the Pyridium every 8 hours as needed for urinary discomfort.  This will turn your urine a bright red-orange.  Increase your oral fluid intake so that you increase your urine production and or flushing your urinary system.  Take an over-the-counter probiotic, such as Culturelle-Align-Activia, 1 hour after each dose of antibiotic to prevent diarrhea or yeast infections from forming.  We will culture urine and change the antibiotics if necessary.  Return for reevaluation, or see your primary care provider, for any new or worsening symptoms.

## 2023-01-22 NOTE — ED Provider Notes (Signed)
[facility]    CSN: [id number] Arrival date & time: 01/22/23  1823      History   Chief Complaint Chief Complaint  Patient presents with   Urinary Frequency    HPI [name] is a 82 y.o. male.   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  He also has urgency and frequency of urination.  He denies any blood in his urine, fever, low back pain, nausea, or vomiting.  He has been using over-the-counter Azo with mild relief of his symptoms.  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   Anxiety 07/17/2020   Depression 05/24/2020   Gastroesophageal reflux disease 05/24/2020   Hypomagnesemia 11/28/2019   Hyperkalemia 05/16/2016   Abnormal EKG 05/16/2016   PAF (paroxysmal atrial fibrillation) (HCC) 05/16/2016   Chest pain 05/16/2016   Benign essential HTN 05/14/2016   Mixed hyperlipidemia 04/09/2015   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   DDD (degenerative disc disease), 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 Authorizing Provider  apixaban (ELIQUIS) 5 MG TABS tablet Take 5 mg by mouth 2 (two) times daily.   Yes [provider]  Calcium Carb-Cholecalciferol 600-400 MG-UNIT TABS Take by mouth. 08/02/13  Yes [provider]  cefdinir (OMNICEF) 300 MG capsule Take 1 capsule (300 mg total) by mouth 2 (two) times daily for 10 days. 01/22/23 02/01/23 Yes Becky Augusta, NP  Cholecalciferol 25 MCG (1000 UT) tablet Take 3 tablets by mouth daily. 04/26/20  Yes [provider]  Cyanocobalamin (VITAMIN B-12 PO) Take 1 tablet by mouth daily.   Yes [provider]  cycloSPORINE modified (NEORAL) 25 MG capsule Take 2 capsules by mouth 2 (two) times daily. 08/15/20  Yes [provider]  insulin detemir (LEVEMIR) 100 UNIT/ML injection Inject 18 Units into the skin at bedtime. 09/09/17  Yes [provider]  magnesium oxide (MAG-OX) 400 (240 Mg) MG tablet Take 2 tablets by mouth. 07/02/16  Yes [provider]  MAGNESIUM OXIDE PO Take 1 tablet by mouth 2 (two) times daily. Take 2 tablets at 1300 and 1 tablet at 1600   Yes [provider]  metoprolol tartrate (LOPRESSOR) 25 MG tablet Take 1 tablet (25 mg total) by mouth 2 (two) times daily. 06/06/20  Yes Cook, Jayce G, DO  Multiple Vitamins-Minerals (MULTIVITAMIN ADULT) CHEW Chew 1 tablet by mouth daily.   Yes [provider]  mycophenolate (CELLCEPT) 250 MG capsule Take 250  mg by mouth 2 (two) times daily.   Yes [provider]  omeprazole (PRILOSEC) 20 MG capsule Take 20 mg by mouth daily.   Yes [provider]  phenazopyridine (PYRIDIUM) 200 MG tablet Take 1 tablet (200 mg total) by mouth 3 (three) times daily. 01/22/23  Yes Becky Augusta, NP  pravastatin (PRAVACHOL) 40 MG tablet Take 0.5 tablets by mouth. 07/15/20  Yes [provider]  predniSONE (DELTASONE) 10 MG tablet 50 mg daily x 2 days, then 40 mg daily x 2  days, then 30 mg daily x 2 days, then 20 mg daily x 2 days, then 10 mg daily x 2 days. 03/05/21  Yes Cook, Jayce G, DO  predniSONE (DELTASONE) 5 MG tablet Take 5 mg by mouth daily with breakfast.   Yes [provider]  venlafaxine (EFFEXOR) 37.5 MG tablet Take 75 mg by mouth daily.    Yes [provider]  allopurinol (ZYLOPRIM) 100 MG tablet Take by mouth. 01/30/21 01/30/22  [provider]    Family History Family History  Problem Relation Age of Onset   Alcohol abuse Mother    Emphysema Father     Social History Social History   Tobacco Use   Smoking status: Never   Smokeless tobacco: Never  Vaping Use   Vaping status: Never Used  Substance Use Topics   Alcohol use: No   Drug use: No     Allergies   Ibuprofen   Review of Systems Review of Systems  Constitutional:  Negative for fever.  Gastrointestinal:  Negative for nausea and vomiting.  Genitourinary:  Positive for dysuria, frequency and urgency. Negative for hematuria.  Musculoskeletal:  Negative for back pain.     Physical Exam Triage Vital Signs ED Triage Vitals  Encounter Vitals Group     BP 01/22/23 1846 (!) 146/118     Systolic BP Percentile --      Diastolic BP Percentile --      Pulse Rate 01/22/23 1846 87     Resp --      Temp 01/22/23 1846 98.6 F (37 C)     Temp Source 01/22/23 1846 Oral     SpO2 01/22/23 1846 97 %     Weight 01/22/23 1843 190 lb (86.2 kg)     Height 01/22/23 1843 5\' 8"  (1.727 m)     Head Circumference --      Peak Flow --      Pain Score 01/22/23 1843 5     Pain Loc --      Pain Education --      Exclude from Growth Chart --    No data found.  Updated Vital Signs BP (!) 146/118 (BP Location: Left Arm)   Pulse 87   Temp 98.6 F (37 C) (Oral)   Ht 5\' 8"  (1.727 m)   Wt 190 lb (86.2 kg)   SpO2 97%   BMI 28.89 kg/m   Visual Acuity Right Eye Distance:   Left Eye Distance:   Bilateral Distance:    Right Eye Near:   Left Eye Near:     Bilateral Near:     Physical Exam Vitals and nursing note reviewed.  Constitutional:      Appearance: Normal appearance. He is not ill-appearing.  HENT:     Head: Normocephalic and atraumatic.  Cardiovascular:     Rate and Rhythm: Normal rate and regular rhythm.     Pulses: Normal pulses.     Heart sounds: Normal heart  sounds. No murmur heard.    No friction rub. No gallop.  Pulmonary:     Effort: Pulmonary effort is normal.     Breath sounds: Normal breath sounds. No wheezing, rhonchi or rales.  Abdominal:     Tenderness: There is no right CVA tenderness or left CVA tenderness.  Skin:    General: Skin is warm.     Capillary Refill: Capillary refill takes less than 2 seconds.  Neurological:     General: No focal deficit present.     Mental Status: He is alert and oriented to person, place, and time.      UC Treatments / Results  Labs (all labs ordered are listed, but only abnormal results are displayed) Labs Reviewed  URINALYSIS, W/ REFLEX TO CULTURE (INFECTION SUSPECTED) - Abnormal; Notable for the following components:      Result Value   Color, Urine ORANGE (*)    APPearance HAZY (*)    Glucose, UA   (*)    Value: TEST NOT REPORTED DUE TO COLOR INTERFERENCE OF URINE PIGMENT   Hgb urine dipstick   (*)    Value: TEST NOT REPORTED DUE TO COLOR INTERFERENCE OF URINE PIGMENT   Bilirubin Urine   (*)    Value: TEST NOT REPORTED DUE TO COLOR INTERFERENCE OF URINE PIGMENT   Ketones, ur   (*)    Value: TEST NOT REPORTED DUE TO COLOR INTERFERENCE OF URINE PIGMENT   Protein, ur   (*)    Value: TEST NOT REPORTED DUE TO COLOR INTERFERENCE OF URINE PIGMENT   Nitrite   (*)    Value: TEST NOT REPORTED DUE TO COLOR INTERFERENCE OF URINE PIGMENT   Leukocytes,Ua   (*)    Value: TEST NOT REPORTED DUE TO COLOR INTERFERENCE OF URINE PIGMENT   Bacteria, UA FEW (*)    All other components within normal limits  URINE CULTURE    EKG   Radiology No results  found.  Procedures Procedures (including critical care time)  Medications Ordered in UC Medications - No data to display  Initial Impression / Assessment and Plan / UC Course  I have reviewed the triage vital signs and the nursing notes.  Pertinent labs & imaging results that were available during my care of the patient were reviewed by me and considered in my medical decision making (see chart for details).   Patient is a nontoxic-appearing 82 year old male presenting with a 3-day history of dysuria with urgency and frequency.  He has had UTIs in the past but has not had any recently.  He denies any blood in his urine or fever.  He is status post liver transplant.  His physical exam reveals a benign cardiopulmonary exam and no CVA tenderness.  He has been using Azo with mild relief of his symptoms.  I will order urinalysis to evaluate for the presence of UTI.  Urinalysis is orange in color with a hazy appearance.  Many of the tests were not performed secondary to colorimetric interference.  Reflex microscopy shows 6-10 white cells, 6-10 red cells, and few bacteria.  I will send urine for culture.  I will discharge patient home on cefdinir 300 mg twice daily for 10 days for treatment of UTI.  CMP from 01/20/2023 shows a BUN of 29 and a creatinine 1.5.  Patient's calculated creatinine clearance is 41 mL/min which does not require any dosage adjustment.  Final Clinical Impressions(s) / UC Diagnoses   Final diagnoses:  Lower urinary tract infectious disease  Discharge Instructions      Take the Cefdinir twice daily for 10 days with food for treatment of urinary tract infection.  Use the Pyridium every 8 hours as needed for urinary discomfort.  This will turn your urine a bright red-orange.  Increase your oral fluid intake so that you increase your urine production and or flushing your urinary system.  Take an over-the-counter probiotic, such as Culturelle-Align-Activia, 1 hour after  each dose of antibiotic to prevent diarrhea or yeast infections from forming.  We will culture urine and change the antibiotics if necessary.  Return for reevaluation, or see your primary care provider, for any new or worsening symptoms.      ED Prescriptions     Medication Sig Dispense Auth. Provider   cefdinir (OMNICEF) 300 MG capsule Take 1 capsule (300 mg total) by mouth 2 (two) times daily for 10 days. 20 capsule Becky Augusta, NP   phenazopyridine (PYRIDIUM) 200 MG tablet Take 1 tablet (200 mg total) by mouth 3 (three) times daily. 6 tablet Becky Augusta, NP      PDMP not reviewed this encounter.   Becky Augusta, NP 01/22/23 [id number]

## 2023-01-22 NOTE — ED Triage Notes (Signed)
Pt c/o possible UTI. Pt is having urinary burning, itching x2-3days  Pt has used Urinary pain relief - Phenazopyridine Hydrochloride.  Pt states that the OTC medication did not work.

## 2023-02-18 ENCOUNTER — Ambulatory Visit
Admission: RE | Admit: 2023-02-18 | Discharge: 2023-02-18 | Disposition: A | Discharge status: Home / Self Care | Payer: Medicare Other | Source: Ambulatory Visit | Attending: Physician Assistant | Admitting: Physician Assistant

## 2023-02-18 VITALS — BP 111/72 | HR 68 | Temp 98.7°F | Ht 68.0 in | Wt 200.0 lb

## 2023-02-18 DIAGNOSIS — R350 Frequency of micturition: Secondary | ICD-10-CM

## 2023-02-18 DIAGNOSIS — N410 Acute prostatitis: Secondary | ICD-10-CM

## 2023-02-18 DIAGNOSIS — R300 Dysuria: Secondary | ICD-10-CM | POA: Diagnosis not present

## 2023-02-18 LAB — URINALYSIS, W/ REFLEX TO CULTURE (INFECTION SUSPECTED)

## 2023-02-18 MED ORDER — SULFAMETHOXAZOLE-TRIMETHOPRIM 800-160 MG PO TABS: 1.0000 | ORAL_TABLET | Freq: Two times a day (BID) | ORAL | 0 refills | Status: AC

## 2023-02-18 NOTE — Discharge Instructions (Addendum)
-  Urine does show bacteria and white blood cells.  Possible UTI versus prostate infection but it does appear to be bacterial.  I have sent antibiotic to the pharmacy for you.  Increase your rest and fluids.  May use AZO if needed and if it helps.  We may change your treatment based on your culture but do not discontinue antibiotics unless advised to do so.  Even if the culture is negative you should still continue these antibiotics for prostate infection coverage.

## 2023-02-18 NOTE — ED Triage Notes (Signed)
Patient states treated for UTI about a week or two ago and symptoms have recurred and worse w/ burning x 3 days, no fever.  Took Azo yesterday, no OTC pain meds

## 2023-02-18 NOTE — ED Provider Notes (Signed)
[facility]    CSN: [id number] Arrival date & time: 02/18/23  1414      History   Chief Complaint Chief Complaint  Patient presents with   Urinary Frequency    UTI symptoms - Entered by patient    HPI [name] is a 82 y.o. male presenting for dysuria, frequency, urgency x 1 week.  Patient was seen last month for similar symptoms and thought to have a UTI.  He was treated with cefdinir.  He did not complete the medication because he was told the urine culture was negative.  He says symptoms do get better but they never completely resolved and seem to get worse over the past week and especially over the past 3 days.  He reports a lot of burning with urination.  He says he thinks his prostate is infected.  States "it feels like I am sitting on a walnut."  He reports a history of prostate problems and UTIs in his 73s but has not really had any problems later in life.  He has taken AZO yesterday but none today.  Denies fever, flank pain, hematuria, back pain, testicular pain or swelling.  His medical history is significant for atrial fibrillation, liver cirrhosis with history of liver transplant, hypertension, GERD, CKD stage III, and hyperlipidemia.    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   Anxiety 07/17/2020   Depression 05/24/2020   Gastroesophageal reflux disease 05/24/2020   Hypomagnesemia 11/28/2019   Hyperkalemia 05/16/2016   Abnormal EKG 05/16/2016   PAF (paroxysmal atrial fibrillation) (HCC) 05/16/2016   Chest pain 05/16/2016   Benign essential HTN 05/14/2016   Mixed hyperlipidemia 04/09/2015   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   DDD (degenerative disc disease), 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 Authorizing Provider  fluorouracil (EFUDEX) 5 % cream Apply topically. 05/16/21  Yes [provider]  sulfamethoxazole-trimethoprim (BACTRIM DS) 800-160 MG tablet Take 1 tablet by mouth 2 (two) times daily for 10 days. 02/18/23 02/28/23 Yes Shirlee Latch, PA-C  allopurinol (ZYLOPRIM) 100 MG tablet Take by mouth. 01/30/21 01/30/22  [provider]  apixaban (ELIQUIS) 5 MG TABS tablet Take 5 mg by mouth 2 (two) times daily.    [provider]  Calcium Carb-Cholecalciferol 600-400 MG-UNIT TABS Take by mouth. 08/02/13   [provider]  Cholecalciferol 25 MCG (1000 UT) tablet Take 3 tablets by mouth daily. 04/26/20   [provider]  Cyanocobalamin (VITAMIN B-12 PO) Take 1 tablet by mouth daily.    [provider]  cycloSPORINE modified (NEORAL) 25 MG capsule Take 2 capsules by mouth  2 (two) times daily. 08/15/20   [provider]  insulin detemir (LEVEMIR) 100 UNIT/ML injection Inject 18 Units into the skin at bedtime. 09/09/17   [provider]  magnesium oxide (MAG-OX) 400 (240 Mg) MG tablet Take 2 tablets by mouth. 07/02/16   [provider]  MAGNESIUM OXIDE PO Take 1 tablet by mouth 2 (two) times daily. Take 2 tablets at 1300 and 1 tablet at 1600    [provider]  metoprolol tartrate (LOPRESSOR) 25 MG tablet Take 1 tablet (25 mg total) by mouth 2 (two)  times daily. 06/06/20   Tommie Sams, DO  Multiple Vitamins-Minerals (MULTIVITAMIN ADULT) CHEW Chew 1 tablet by mouth daily.    [provider]  mycophenolate (CELLCEPT) 250 MG capsule Take 250 mg by mouth 2 (two) times daily.    [provider]  omeprazole (PRILOSEC) 20 MG capsule Take 20 mg by mouth daily.    [provider]  phenazopyridine (PYRIDIUM) 200 MG tablet Take 1 tablet (200 mg total) by mouth 3 (three) times daily. 01/22/23   Becky Augusta, NP  pravastatin (PRAVACHOL) 40 MG tablet Take 0.5 tablets by mouth. 07/15/20   [provider]  predniSONE (DELTASONE) 10 MG tablet 50 mg daily x 2 days, then 40 mg daily x 2 days, then 30 mg daily x 2 days, then 20 mg daily x 2 days, then 10 mg daily x 2 days. 03/05/21   Tommie Sams, DO  predniSONE (DELTASONE) 5 MG tablet Take 5 mg by mouth daily with breakfast.    [provider]  venlafaxine (EFFEXOR) 37.5 MG tablet Take 75 mg by mouth daily.     [provider]    Family History Family History  Problem Relation Age of Onset   Alcohol abuse Mother    Emphysema Father     Social History Social History   Tobacco Use   Smoking status: Never   Smokeless tobacco: Never  Vaping Use   Vaping status: Never Used  Substance Use Topics   Alcohol use: No   Drug use: No     Allergies   Ibuprofen   Review of Systems Review of Systems  Constitutional:  Negative for fatigue and fever.  Gastrointestinal:  Negative for abdominal pain, nausea and vomiting.  Genitourinary:  Positive for dysuria, frequency and urgency. Negative for genital sores, hematuria, penile discharge, penile pain, penile swelling, scrotal swelling and testicular pain.  Skin:  Negative for rash.  Neurological:  Negative for weakness.     Physical Exam Triage Vital Signs ED Triage Vitals  Encounter Vitals Group     BP --      Systolic BP Percentile --      Diastolic BP Percentile --      Pulse --      Resp --       Temp --      Temp src --      SpO2 --      Weight 02/18/23 1441 200 lb (90.7 kg)     Height 02/18/23 1441 5\' 8"  (1.727 m)     Head Circumference --      Peak Flow --      Pain Score 02/18/23 1440 4     Pain Loc --      Pain Education --      Exclude from Growth Chart --    No data found.  Updated Vital Signs BP 111/72 (BP Location: Left Arm)   Pulse 68  Temp 98.7 F (37.1 C) (Oral)   Ht 5\' 8"  (1.727 m)   Wt 200 lb (90.7 kg)   SpO2 97%   BMI 30.41 kg/m     Physical Exam Vitals and nursing note reviewed.  Constitutional:      General: He is not in acute distress.    Appearance: Normal appearance. He is well-developed. He is not ill-appearing.  HENT:     Head: Normocephalic and atraumatic.  Eyes:     General: No scleral icterus.    Conjunctiva/sclera: Conjunctivae normal.  Cardiovascular:     Rate and Rhythm: Normal rate and regular rhythm.     Heart sounds: Normal heart sounds.  Pulmonary:     Effort: Pulmonary effort is normal. No respiratory distress.     Breath sounds: Normal breath sounds.  Abdominal:     Palpations: Abdomen is soft.     Tenderness: There is no abdominal tenderness. There is no right CVA tenderness or left CVA tenderness.  Musculoskeletal:     Cervical back: Neck supple.  Skin:    General: Skin is warm and dry.     Capillary Refill: Capillary refill takes less than 2 seconds.  Neurological:     General: No focal deficit present.     Mental Status: He is alert. Mental status is at baseline.     Motor: No weakness.     Gait: Gait normal.  Psychiatric:        Mood and Affect: Mood normal.        Behavior: Behavior normal.      UC Treatments / Results  Labs (all labs ordered are listed, but only abnormal results are displayed) Labs Reviewed  URINALYSIS, W/ REFLEX TO CULTURE (INFECTION SUSPECTED) - Abnormal; Notable for the following components:      Result Value   Color, Urine ORANGE (*)    APPearance HAZY (*)    Glucose, UA    (*)    Value: TEST NOT REPORTED DUE TO COLOR INTERFERENCE OF URINE PIGMENT   Hgb urine dipstick   (*)    Value: TEST NOT REPORTED DUE TO COLOR INTERFERENCE OF URINE PIGMENT   Bilirubin Urine   (*)    Value: TEST NOT REPORTED DUE TO COLOR INTERFERENCE OF URINE PIGMENT   Ketones, ur   (*)    Value: TEST NOT REPORTED DUE TO COLOR INTERFERENCE OF URINE PIGMENT   Protein, ur   (*)    Value: TEST NOT REPORTED DUE TO COLOR INTERFERENCE OF URINE PIGMENT   Nitrite   (*)    Value: TEST NOT REPORTED DUE TO COLOR INTERFERENCE OF URINE PIGMENT   Leukocytes,Ua   (*)    Value: TEST NOT REPORTED DUE TO COLOR INTERFERENCE OF URINE PIGMENT   Bacteria, UA MANY (*)    All other components within normal limits  URINE CULTURE    EKG   Radiology No results found.  Procedures Procedures (including critical care time)  Medications Ordered in UC Medications - No data to display  Initial Impression / Assessment and Plan / UC Course  I have reviewed the triage vital signs and the nursing notes.  Pertinent labs & imaging results that were available during my care of the patient were reviewed by me and considered in my medical decision making (see chart for details).   82 year old male presents for 1 week history of dysuria, frequency, urgency 1 week, worse over the past 3 days.  Treated for possible UTI last month with cefdinir  but urine culture was negative.  He has a history of prostate problems and thinks he has a prostate infection.  He says it "feels like I am sitting on a walnut.  Denies fever.  Urine today shows orange hazy appearance related to the AZO.  Many bacteria and white blood cell clumps are seen.  Urine will be sent for culture.  Advised patient we will cover him for possible UTI and prostate infection with Bactrim DS.  If urine culture is negative he should continue the Bactrim DS to cover prostate infection.  If culture suggest a different bacteria and antibiotic needs to be changed,  advised him someone will contact him and do so.  He should increase his rest and fluids.  If any symptoms were to worsen he should go to the ER.   Final Clinical Impressions(s) / UC Diagnoses   Final diagnoses:  Dysuria  Urinary frequency  Acute prostatitis     Discharge Instructions      -Urine does show bacteria and white blood cells.  Possible UTI versus prostate infection but it does appear to be bacterial.  I have sent antibiotic to the pharmacy for you.  Increase your rest and fluids.  May use AZO if needed and if it helps.  We may change your treatment based on your culture but do not discontinue antibiotics unless advised to do so.  Even if the culture is negative you should still continue these antibiotics for prostate infection coverage.     ED Prescriptions     Medication Sig Dispense Auth. Provider   sulfamethoxazole-trimethoprim (BACTRIM DS) 800-160 MG tablet Take 1 tablet by mouth 2 (two) times daily for 10 days. 20 tablet Gareth Morgan      PDMP not reviewed this encounter.   Shirlee Latch, PA-C 02/18/23 1536

## 2023-02-20 LAB — URINE CULTURE: Culture: NO GROWTH

## 2023-03-01 ENCOUNTER — Ambulatory Visit
Admission: RE | Admit: 2023-03-01 | Discharge: 2023-03-01 | Disposition: A | Discharge status: Home / Self Care | Payer: Medicare Other | Source: Ambulatory Visit | Attending: Emergency Medicine | Admitting: Emergency Medicine

## 2023-03-01 VITALS — BP 107/72 | HR 71 | Temp 98.0°F | Resp 18 | Wt 198.4 lb

## 2023-03-01 DIAGNOSIS — R300 Dysuria: Secondary | ICD-10-CM | POA: Diagnosis not present

## 2023-03-01 LAB — URINALYSIS, W/ REFLEX TO CULTURE (INFECTION SUSPECTED)
Glucose, UA: NEGATIVE mg/dL
Ketones, ur: NEGATIVE mg/dL
Leukocytes,Ua: NEGATIVE
Nitrite: NEGATIVE
Protein, ur: NEGATIVE mg/dL
Specific Gravity, Urine: >1.03 — ABNORMAL HIGH (ref 1.005–1.030)
pH: 5.5 (ref 5.0–8.0)

## 2023-03-01 MED ORDER — PHENAZOPYRIDINE HCL 200 MG PO TABS: 200.0000 mg | ORAL_TABLET | Freq: Three times a day (TID) | ORAL | 0 refills | Status: DC | PRN

## 2023-03-01 NOTE — ED Triage Notes (Signed)
Patient is still having urinary sx from last visit.   Urge to urinate, burning while urination

## 2023-03-01 NOTE — ED Provider Notes (Signed)
[facility]    CSN: [id number] Arrival date & time: 03/01/23  1147      History   Chief Complaint Chief Complaint  Patient presents with   Urinary Frequency    I was in the clinic ten days ago for Prostitus . I've used up all my medication yet the burning is still there. - Entered by patient    HPI [name] is a 82 y.o. male.   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  He was seen in this urgent care 10 days ago and diagnosed with UTI and prostatitis and treated with a 10-day course of Bactrim.  He reports that the burning has improved but he still continues to have urgency.  No fever, nausea, vomiting, back pain, or blood in his urine.  No history of caffeine use he has not taken any cold medication.  He has not noticed any changes to his urinary stream.  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   Anxiety 07/17/2020   Depression 05/24/2020   Gastroesophageal reflux disease 05/24/2020   Hypomagnesemia 11/28/2019   Hyperkalemia 05/16/2016   Abnormal EKG 05/16/2016   PAF (paroxysmal atrial fibrillation) (HCC) 05/16/2016   Chest pain 05/16/2016   Benign essential HTN 05/14/2016   Mixed hyperlipidemia 04/09/2015   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   DDD (degenerative disc disease), 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 Authorizing Provider  allopurinol (ZYLOPRIM) 100 MG tablet Take by mouth. 01/30/21 01/30/22  [provider]  apixaban (ELIQUIS) 5 MG TABS tablet Take 5 mg by mouth 2 (two) times daily.    [provider]  Calcium Carb-Cholecalciferol 600-400 MG-UNIT TABS Take by mouth. 08/02/13   [provider]  Cholecalciferol 25 MCG (1000 UT) tablet Take 3 tablets by mouth daily. 04/26/20   [provider]  Cyanocobalamin (VITAMIN B-12 PO) Take 1 tablet by mouth daily.    [provider]  cycloSPORINE modified (NEORAL) 25 MG capsule Take 2 capsules by mouth 2 (two) times daily. 08/15/20   [provider]  fluorouracil (EFUDEX) 5 % cream Apply topically. 05/16/21   [provider]  insulin detemir (LEVEMIR) 100 UNIT/ML injection Inject 18 Units into the skin at bedtime. 09/09/17   [provider]  magnesium oxide (MAG-OX) 400 (240 Mg) MG tablet Take 2 tablets by mouth. 07/02/16   [provider]  MAGNESIUM OXIDE PO Take 1 tablet by mouth 2 (two) times daily. Take 2 tablets at 1300 and 1  tablet at 1600    [provider]  metoprolol tartrate (LOPRESSOR) 25 MG tablet Take 1 tablet (25 mg total) by mouth 2 (two) times daily. 06/06/20   Tommie Sams, DO  Multiple Vitamins-Minerals (MULTIVITAMIN ADULT) CHEW Chew 1 tablet by mouth daily.    [provider]  mycophenolate (CELLCEPT) 250 MG capsule Take 250 mg by mouth 2 (two) times daily.    [provider]  omeprazole (PRILOSEC) 20 MG capsule Take 20 mg by mouth daily.    [provider]  phenazopyridine (PYRIDIUM) 200 MG  tablet Take 1 tablet (200 mg total) by mouth 3 (three) times daily as needed for pain. 03/01/23   Becky Augusta, NP  pravastatin (PRAVACHOL) 40 MG tablet Take 0.5 tablets by mouth. 07/15/20   [provider]  predniSONE (DELTASONE) 10 MG tablet 50 mg daily x 2 days, then 40 mg daily x 2 days, then 30 mg daily x 2 days, then 20 mg daily x 2 days, then 10 mg daily x 2 days. 03/05/21   Tommie Sams, DO  predniSONE (DELTASONE) 5 MG tablet Take 5 mg by mouth daily with breakfast.    [provider]  venlafaxine (EFFEXOR) 37.5 MG tablet Take 75 mg by mouth daily.     [provider]    Family History Family History  Problem Relation Age of Onset   Alcohol abuse Mother    Emphysema Father     Social History Social History   Tobacco Use   Smoking status: Never   Smokeless tobacco: Never  Vaping Use   Vaping status: Never Used  Substance Use Topics   Alcohol use: No   Drug use: No     Allergies   Ibuprofen   Review of Systems Review of Systems  Constitutional:  Negative for fever.  Gastrointestinal:  Negative for abdominal pain, nausea and vomiting.  Genitourinary:  Positive for dysuria, frequency and urgency. Negative for hematuria.  Musculoskeletal:  Negative for back pain.     Physical Exam Triage Vital Signs ED Triage Vitals  Encounter Vitals Group     BP 03/01/23 1209 107/72     Systolic BP Percentile --      Diastolic BP Percentile --      Pulse Rate 03/01/23 1209 71     Resp 03/01/23 1209 18     Temp 03/01/23 1209 98 F (36.7 C)     Temp Source 03/01/23 1209 Oral     SpO2 03/01/23 1209 98 %     Weight 03/01/23 1207 198 lb 6.6 oz (90 kg)     Height --      Head Circumference --      Peak Flow --      Pain Score 03/01/23 1208 2     Pain Loc --      Pain Education --      Exclude from Growth Chart --    No data found.  Updated Vital Signs BP 107/72 (BP Location: Left Arm)   Pulse 71   Temp 98 F (36.7 C) (Oral)   Resp 18   Wt  198 lb 6.6 oz (90 kg)   SpO2 98%   BMI 30.17 kg/m   Visual Acuity Right Eye Distance:   Left Eye Distance:   Bilateral Distance:    Right Eye Near:   Left Eye Near:    Bilateral Near:     Physical Exam Vitals and nursing note reviewed.  Constitutional:  Appearance: Normal appearance.  HENT:     Head: Normocephalic and atraumatic.  Cardiovascular:     Rate and Rhythm: Normal rate and regular rhythm.     Pulses: Normal pulses.     Heart sounds: Normal heart sounds. No murmur heard.    No friction rub. No gallop.  Pulmonary:     Effort: Pulmonary effort is normal.     Breath sounds: Normal breath sounds. No wheezing, rhonchi or rales.  Abdominal:     Tenderness: There is no right CVA tenderness or left CVA tenderness.  Skin:    General: Skin is warm and dry.     Capillary Refill: Capillary refill takes less than 2 seconds.  Neurological:     General: No focal deficit present.     Mental Status: He is alert and oriented to person, place, and time.      UC Treatments / Results  Labs (all labs ordered are listed, but only abnormal results are displayed) Labs Reviewed  URINALYSIS, W/ REFLEX TO CULTURE (INFECTION SUSPECTED) - Abnormal; Notable for the following components:      Result Value   Specific Gravity, Urine >1.030 (*)    Hgb urine dipstick TRACE (*)    Bilirubin Urine SMALL (*)    Bacteria, UA RARE (*)    All other components within normal limits    EKG   Radiology No results found.  Procedures Procedures (including critical care time)  Medications Ordered in UC Medications - No data to display  Initial Impression / Assessment and Plan / UC Course  I have reviewed the triage vital signs and the nursing notes.  Pertinent labs & imaging results that were available during my care of the patient were reviewed by me and considered in my medical decision making (see chart for details).   Patient is a pleasant, nontoxic-appearing 82 year old male  presenting for evaluation of urinary symptoms as outlined HPI above.  He has completed a 10-day course of Bactrim and his symptoms continue though they have improved.  On exam his cardiopulmonary dam is benign and he has no CVA tenderness.  DRE deferred at this time.  I will order a urinalysis to evaluate for the presence of UTI.  Patient's last urine was sent for culture which showed no growth.  Urinalysis shows high specific gravity with trace hemoglobin and small bilirubin.  Negative for leukocyte esterase, nitrates, or protein.  Reflex microscopy shows 0-5 WBCs, 11-20 RBCs, and rare bacteria.  There are also hyaline casts, amorphous crystals, calcium oxalate crystals, and mucus present.  I suspect that the hyaline casts are secondary to patient's chronic kidney disease.  The amorphous crystals and calcium oxalate crystals may be causing the patient's urinary discomfort.  I will discharge him home on Pyridium to help with the dysuria and have him increase his oral fluid intake to help increase his urine production and flush his urinary tract.  Additionally, I will refer him to urology for further evaluation.   Final Clinical Impressions(s) / UC Diagnoses   Final diagnoses:  Dysuria     Discharge Instructions      Urinalysis did not show any signs of infection.  It did show that you have calcium oxalate crystals, amorphous crystals, and hyaline casts in your urine.  Some of these can cause the discomfort with urination along with urinary urgency.  Take the Pyridium every 8 hours as needed for urinary discomfort.  Increase your oral fluid intake so that you increase your urine production  and help to flush your urinary tract.  I have referred you to urology for further evaluation of your symptoms.     ED Prescriptions     Medication Sig Dispense Auth. Provider   phenazopyridine (PYRIDIUM) 200 MG tablet Take 1 tablet (200 mg total) by mouth 3 (three) times daily as needed for pain. 6  tablet Becky Augusta, NP      PDMP not reviewed this encounter.   Becky Augusta, NP 03/01/23 1251

## 2023-03-01 NOTE — Discharge Instructions (Addendum)
Urinalysis did not show any signs of infection.  It did show that you have calcium oxalate crystals, amorphous crystals, and hyaline casts in your urine.  Some of these can cause the discomfort with urination along with urinary urgency.  Take the Pyridium every 8 hours as needed for urinary discomfort.  Increase your oral fluid intake so that you increase your urine production and help to flush your urinary tract.  I have referred you to urology for further evaluation of your symptoms.

## 2023-03-10 ENCOUNTER — Other Ambulatory Visit
Admission: RE | Admit: 2023-03-10 | Discharge: 2023-03-10 | Disposition: A | Discharge status: Home / Self Care | Payer: Medicare Other | Attending: Urology | Admitting: Urology

## 2023-03-10 ENCOUNTER — Other Ambulatory Visit: Payer: Self-pay | Admitting: *Deleted

## 2023-03-10 ENCOUNTER — Ambulatory Visit: Payer: Self-pay | Admitting: Urology

## 2023-03-10 ENCOUNTER — Encounter: Payer: Self-pay | Admitting: Urology

## 2023-03-10 VITALS — BP 112/68 | HR 71 | Ht 67.0 in | Wt 196.0 lb

## 2023-03-10 DIAGNOSIS — R300 Dysuria: Secondary | ICD-10-CM | POA: Diagnosis not present

## 2023-03-10 LAB — URINALYSIS, COMPLETE (UACMP) WITH MICROSCOPIC
Bilirubin Urine: NEGATIVE
Glucose, UA: NEGATIVE mg/dL
Ketones, ur: NEGATIVE mg/dL
Leukocytes,Ua: NEGATIVE
Nitrite: NEGATIVE
Protein, ur: NEGATIVE mg/dL
Specific Gravity, Urine: 1.025 (ref 1.005–1.030)
Squamous Epithelial / HPF: NONE SEEN /HPF (ref 0–5)
WBC, UA: NONE SEEN WBC/hpf (ref 0–5)
pH: 5.5 (ref 5.0–8.0)

## 2023-03-10 LAB — BLADDER SCAN AMB NON-IMAGING

## 2023-03-10 MED ORDER — DOXYCYCLINE HYCLATE 100 MG PO CAPS: 100.0000 mg | ORAL_CAPSULE | Freq: Two times a day (BID) | ORAL | 0 refills | Status: DC

## 2023-03-10 NOTE — Progress Notes (Signed)
03/10/23 1:01 PM   [name] 1941/05/31 [id number]  CC: Dysuria  HPI: 82 year old male who reports intermittent dysuria since early August 2024.  He was seen in urgent care multiple times, urinalysis appeared infected and he was originally treated with a few day course of cefdinir, culture was negative and he was called and told to stop the antibiotics.  He was seen a week later and urinalysis again showed mild pyuria, microscopic hematuria, bacteria, and WBC clumps, and he was treated with a 10-day course of Bactrim.  Culture was again negative.  He denies any improvement in his urinary symptoms with the antibiotics.  He denies any gross hematuria.  Has some mild urgency and frequency, no problems overnight.  He feels like the burning is in the mid shaft of the penis.  He underwent a microscopic hematuria workup in May 2022 at [facility], CT showed no abnormalities aside from a nonobstructing right renal stones and cystoscopy was benign.  Urinalysis today is benign  PMH: Past Medical History:  Diagnosis Date   Atrial fibrillation (HCC)    Cirrhosis of liver (HCC)    Diabetes mellitus without complication (HCC)    Dysrhythmia    Liver disease     Surgical History: Past Surgical History:  Procedure Laterality Date   CARDIAC ELECTROPHYSIOLOGY STUDY AND ABLATION  08/25/2017   EYE SURGERY     HERNIA REPAIR     Liver Transplant Surgery       Family History: Family History  Problem Relation Age of Onset   Alcohol abuse Mother    Emphysema Father     Social History:  reports that he has never smoked. He has never used smokeless tobacco. He reports that he does not drink alcohol and does not use drugs.  Physical Exam: BP 112/68 (BP Location: Left Arm, Patient Position: Sitting, Cuff Size: Normal)   Pulse 71   Ht 5\' 7"  (1.702 m)   Wt 196 lb (88.9 kg)   BMI 30.70 kg/m    Constitutional:  Alert and oriented, No acute distress. Cardiovascular: No clubbing, cyanosis, or  edema. Respiratory: Normal respiratory effort, no increased work of breathing. GI: Abdomen is soft, nontender, nondistended, no abdominal masses GU: Circumcised phallus with patent meatus, no lesions DRE: 30 g smooth prostate with no lesions, masses, or abnormalities, nontender   Assessment & Plan:   82 year old male with 6 to 8 weeks of dysuria of unclear etiology.  Urine samples initially showed microscopic hematuria and pyuria as well as bacteria, but cultures were ultimately negative, he had no improvement with a 10-day course of Bactrim.  Urinalysis today is benign, physical exam today benign.  We reviewed possible etiologies including urethritis, pelvic floor dysfunction, prostatitis, IC.  I recommended trial of doxycycline 100 mg twice daily x 10 days with close follow-up for cystoscopy in 2 to 3 weeks.  If symptoms resolve on doxycycline okay to cancel cystoscopy  Legrand Rams, MD 03/10/2023  [facility] [address] [place], [place] [address] [phone]

## 2023-03-10 NOTE — Patient Instructions (Signed)
Urethritis, Adult  Urethritis is a swelling (inflammation) of the urethra. The urethra is the tube that drains urine from the bladder. It is important to get treatment for this condition early. Delayed treatment may lead to complications, such as an infection in the urinary tract (ureters, kidneys, and bladder). What are the causes? This condition may be caused by: Germs that are spread through sexual contact. This is the leading cause of urethritis. This may include bacterial or viral infections. Injury to the urethra. This can happen after a thin, flexible tube (catheter) is inserted into the urethra to drain urine, or after medical instruments or foreign bodies are inserted into the area. Chemical irritation. This may include contact with spermicide or prolonged contact with chemicals in bubble bath, shampoo, or perfumed soaps. A disease that causes inflammation. This is rare. What increases the risk? The following factors may make you more likely to develop this condition: Having sex without using a condom. Having multiple sexual partners. Having poor hygiene. What are the signs or symptoms? Symptoms of this condition include: Pain with urination. Frequent urination. An urgent need to urinate. Itching and pain in the vagina or penis. Discharge or bleeding coming from the penis. Most women have no symptoms. How is this diagnosed? This condition may be diagnosed based on: Your symptoms. Your medical history. A physical exam. Tests may also be done. These may include: Urine tests. Swabs from the urethra. How is this treated? Treatment for this condition depends on the cause. Urethritis caused by a bacterial infection is treated with antibiotic medicine. Sexual partners must also be treated. Follow these instructions at home: Medicines Take over-the-counter and prescription medicines only as told by your health care provider. If you were prescribed an antibiotic, take it as told  by your health care provider. Do not stop taking the antibiotic even if you start to feel better. Lifestyle Avoid using perfumed soaps, bubble bath, and shampoo when you bathe or shower. Rinse the vaginal area after bathing. Wear cotton underwear. Not wearing underwear when going to sleep can help. Make sure to wipe from front to back after using the toilet if you are male. Do not have sex until your health care provider approves. When you do have sex, be sure to practice safe sex. Any sexual partners you have had in the past 60 days should be treated. General instructions Drink enough fluid to keep your urine pale yellow. It is up to you to get your test results. Ask your health care provider, or the department that is doing the test, when your results will be ready. Keep all follow-up visits. This is important. Get tested again 3 months after treatment to make sure the infection is gone. It is important that your sexual partner also gets tested again. Contact a health care provider if: Your symptoms have not improved after 3 days. Your symptoms get worse. You have eye redness or pain. You develop abdominal pain or pelvic pain (in females). You develop joint pain or a rash. You have a fever or chills. Get help right away if: You have severe pain in the belly, back, or side. You vomit repeatedly. Summary Urethritis is a swelling (inflammation) of the urethra. Germs that are spread through sexual contact are the most common cause of this condition. It is important to get treatment for this condition early. Delayed treatment may lead to complications. Treatment for this condition depends on the cause. Any sexual partners must also be treated. This information is not  intended to replace advice given to you by your health care provider. Make sure you discuss any questions you have with your health care provider. Document Revised: 01/07/2020 Document Reviewed: 01/07/2020 Elsevier Patient  Education  2024 [employer].

## 2023-03-31 ENCOUNTER — Ambulatory Visit: Payer: Medicare Other | Admitting: Urology

## 2023-03-31 ENCOUNTER — Encounter: Payer: Self-pay | Admitting: Urology

## 2023-03-31 VITALS — BP 115/82 | HR 86 | Ht 68.0 in | Wt 205.0 lb

## 2023-03-31 DIAGNOSIS — R300 Dysuria: Secondary | ICD-10-CM

## 2023-03-31 MED ORDER — SULFAMETHOXAZOLE-TRIMETHOPRIM 800-160 MG PO TABS: 1.0000 | ORAL_TABLET | Freq: Two times a day (BID) | ORAL | 0 refills | Status: DC

## 2023-03-31 NOTE — Progress Notes (Signed)
03/31/2023 1:06 PM   Elliot Gault 1941-06-01 [id number]  Reason for visit: Follow up dysuria  HPI: 82 year old male here again with his daughter today for follow-up of intermittent dysuria.  This originally started in August 2024 and I saw him on 03/10/2023.  He had been seen in urgent care multiple times and urinalysis did appear infected, he was treated with only a few days of cefdinir but stopped that medication secondary to side effects, repeat urinalysis again showed mild pyuria, bacteria, WBC clumps, but culture was negative.  I recommended doxycycline 100 mg twice daily x 10 days for possible prostatitis versus urethritis, but he only completed 1 day of the doxycycline secondary to GI upset.  He had a negative microscopic hematuria workup in May 2022 at [facility] with CT and cystoscopy.  He was originally scheduled for cystoscopy today but would like to defer.  He is still having some intermittent dysuria but feels that has improved slightly.  He is continuing to drink a fair amount of the flavored drinks which may also be contributing.  I recommended a 10-day course of Bactrim for possible prostatitis, with close follow-up in 3 to 4 weeks to re-consider cystoscopy if persistent symptoms.  Bactrim DS twice daily x 10 days for possible prostatitis Avoid bladder irritants RTC 3 to 4 weeks cystoscopy, can cancel cystoscopy if symptoms resolved with antibiotics   Sondra Come, MD  [facility] [address] [place], [place] [address] [phone]

## 2023-04-28 ENCOUNTER — Ambulatory Visit: Payer: Medicare Other | Admitting: Urology

## 2023-04-28 ENCOUNTER — Encounter: Payer: Self-pay | Admitting: Urology

## 2023-04-28 VITALS — BP 110/68 | HR 80 | Ht 67.0 in | Wt 203.0 lb

## 2023-04-28 DIAGNOSIS — Z09 Encounter for follow-up examination after completed treatment for conditions other than malignant neoplasm: Secondary | ICD-10-CM

## 2023-04-28 DIAGNOSIS — R300 Dysuria: Secondary | ICD-10-CM

## 2023-04-28 DIAGNOSIS — Z87898 Personal history of other specified conditions: Secondary | ICD-10-CM

## 2023-04-28 NOTE — Progress Notes (Signed)
   04/28/2023 1:41 PM   Elliot Gault August 02, 1940 [id number]  Reason for visit: Follow up dysuria  HPI: 82 year old male here again with his daughter today for follow-up of intermittent dysuria.This originally started in August 2024 and I saw him on 03/10/2023. He had been seen in urgent care multiple times and urinalysis did appear infected, he was treated with only a few days of cefdinir but stopped that medication secondary to side effects, repeat urinalysis again showed mild pyuria, bacteria, WBC clumps, but culture was negative. I recommended doxycycline 100 mg twice daily x 10 days for possible prostatitis versus urethritis, but he only completed 1 day of the doxycycline secondary to GI upset.  When I saw him in October 2024 for cystoscopy for further evaluation his symptoms had improved slightly and he deferred cystoscopy.  I recommended a 10-day course of Bactrim at that time for possible prostatitis, again unfortunately it sounds like he only took a few days of the antibiotics secondary to fatigue and GI upset.  He was scheduled for cystoscopy today.  He reports his symptoms have completely resolved and he denies any complaints today.  He has worked on avoiding bladder irritants and diet drinks.  He did have a negative microscopic hematuria workup in May 2022 at [facility] with CT and cystoscopy.  He opts to defer further workup at this time which I think is reasonable.  The risks of missing additional pathology by deferring cystoscopy were discussed.  Return precautions reviewed at length.  He prefers follow-up as needed   Sondra Come, MD  [facility] [address] [place], [place] [address] [phone]

## 2023-06-23 ENCOUNTER — Ambulatory Visit: Payer: Medicare Other | Admitting: Urology

## 2023-06-23 ENCOUNTER — Other Ambulatory Visit: Payer: Self-pay

## 2023-06-23 ENCOUNTER — Other Ambulatory Visit
Admission: RE | Admit: 2023-06-23 | Discharge: 2023-06-23 | Disposition: A | Discharge status: Home / Self Care | Payer: Medicare Other | Attending: Urology | Admitting: Urology

## 2023-06-23 VITALS — BP 93/55 | HR 81 | Ht 67.0 in | Wt 204.0 lb

## 2023-06-23 DIAGNOSIS — R300 Dysuria: Secondary | ICD-10-CM

## 2023-06-23 LAB — URINALYSIS, COMPLETE (UACMP) WITH MICROSCOPIC
Glucose, UA: 250 mg/dL — AB
Hgb urine dipstick: NEGATIVE
Ketones, ur: 15 mg/dL — AB
Nitrite: POSITIVE — AB
Protein, ur: 100 mg/dL — AB
Specific Gravity, Urine: 1.015 (ref 1.005–1.030)
pH: 5 (ref 5.0–8.0)

## 2023-06-23 MED ORDER — LEVOFLOXACIN 500 MG PO TABS: 500.0000 mg | ORAL_TABLET | Freq: Every day | ORAL | 0 refills | Status: DC

## 2023-06-23 NOTE — Progress Notes (Signed)
   06/23/2023 1:00 PM   [name] [birth year] [id number]  Reason for visit: Intermittent dysuria  HPI: 83 year old male who I have seen multiple times for intermittent dysuria.  This originally started in August 2024, he had been seen in urgent care multiple times and urinalysis did initially appear grossly infected, he was treated with only a few days of antibiotics and symptoms improved, however he was called and told that his culture was negative and to stop antibiotics.  Symptoms then returned a few weeks later.  He has also stopped antibiotics early multiple times secondary to bothersome side effects over the last few months, primary side effect is malaise and weakness.  At our visit from November 2024 he was actually scheduled for cystoscopy for further evaluation, but his symptoms had completely resolved and he deferred cystoscopy.  He had a negative microscopic hematuria workup in May 2022 at [facility] with CT and cystoscopy.  Urinalysis today 0-5 squamous cells, 0-5 WBC, 0-5 RBC, few bacteria, large leukocytes, nitrite positive(taking Pyridium ).  We again reviewed possible causes including UTI, prostatitis, urethritis, IC, pelvic floor dysfunction, urethral or bladder malignancy.  He has had some improvement with a few days of antibiotics previously which suggest more of an infectious cause, and he has had trouble completing antibiotics previously which may explain his recurrence of symptoms every few months.  I also offered cystoscopy again today and he deferred.  Levaquin  500 mg daily x 14 days, follow-up culture results If recurrence of symptoms, needs to pursue cystoscopy   [name], MD  [facility] [address] [place], [place] [address] [phone]

## 2023-06-24 LAB — URINE CULTURE: Culture: NO GROWTH

## 2023-07-29 ENCOUNTER — Telehealth: Payer: Self-pay | Admitting: Urology

## 2023-07-29 NOTE — Telephone Encounter (Addendum)
See phone note from today also. Appt with PA made and appt on 2/26 changed to cysto.

## 2023-07-29 NOTE — Telephone Encounter (Signed)
See mychart message information also

## 2023-07-29 NOTE — Telephone Encounter (Signed)
Patient called and he has another UTI. I did make him an appt fro 2/26 in [place] but he states that is burns bad when he urinates and would like something to be called in to hold him over until his appt.

## 2023-07-29 NOTE — Telephone Encounter (Signed)
Spoke with patient. Patient is agreeable to come to [facility] and see someone else for his issues. Apt on 08/11/23 was left and changed to cysto as per LOV note it was stated that would be the next step if symptoms continue.   Sam, FYI, patient is coming to see you tomorrow and I told patient you would discuss further if we need to change the plan.

## 2023-07-30 ENCOUNTER — Ambulatory Visit (INDEPENDENT_AMBULATORY_CARE_PROVIDER_SITE_OTHER): Payer: Medicare Other | Admitting: Physician Assistant

## 2023-07-30 ENCOUNTER — Other Ambulatory Visit: Payer: Self-pay | Admitting: Physician Assistant

## 2023-07-30 ENCOUNTER — Encounter: Payer: Self-pay | Admitting: Physician Assistant

## 2023-07-30 VITALS — BP 98/62 | Ht 67.0 in | Wt 200.0 lb

## 2023-07-30 DIAGNOSIS — R300 Dysuria: Secondary | ICD-10-CM

## 2023-07-30 LAB — URINALYSIS, COMPLETE

## 2023-07-30 LAB — MICROSCOPIC EXAMINATION

## 2023-07-30 LAB — BLADDER SCAN AMB NON-IMAGING: PVR: 16 WU

## 2023-07-30 MED ORDER — URIBEL 118 MG PO CAPS
1.0000 | ORAL_CAPSULE | Freq: Four times a day (QID) | ORAL | 3 refills | Status: DC | PRN
Start: 2023-07-30 — End: 2023-07-30

## 2023-07-30 NOTE — Progress Notes (Signed)
07/30/2023 3:38 PM   [name] 05-Mar-1941 [id number]  CC: Chief Complaint  Patient presents with   Dysuria   HPI: [name] is a 83 y.o. male with PMH diabetes, cirrhosis s/p liver transplant, A-fib on Eliquis, microscopic hematuria with benign workup in 2022 at [facility], nephrolithiasis, and intermittent dysuria who presents today for recurrent dysuria.  He is accompanied today by his daughter, [name], who contributes to HPI.  Today he reports a 2-day history of intermittent dysuria and burning bladder pain at rest.  He has been taking Azo, which helps.  He denies flank pain, nausea, or vomiting.  In-office UA today pan positive in the setting of Azo use; urine microscopy with 3-10 RBCs/HPF, uric acid crystals, and moderate bacteria. PVR 16mL.  PMH: Past Medical History:  Diagnosis Date   Atrial fibrillation (HCC)    Cirrhosis of liver (HCC)    Diabetes mellitus without complication (HCC)    Dysrhythmia    Liver disease     Surgical History: Past Surgical History:  Procedure Laterality Date   CARDIAC ELECTROPHYSIOLOGY STUDY AND ABLATION  08/25/2017   EYE SURGERY     HERNIA REPAIR     Liver Transplant Surgery      Home Medications:  Allergies as of 07/30/2023       Reactions   Levaquin [levofloxacin] Other (See Comments)   Muscle and joint pain    Ibuprofen Other (See Comments)   Liver transplant        Medication List        Accurate as of July 30, 2023  3:38 PM. If you have any questions, ask your nurse or doctor.          allopurinol 100 MG tablet Commonly known as: ZYLOPRIM Take by mouth.   amLODipine 2.5 MG tablet Commonly known as: NORVASC Take 2.5 mg by mouth daily.   apixaban 5 MG Tabs tablet Commonly known as: ELIQUIS Take 5 mg by mouth 2 (two) times daily.   BD Insulin Syringe U/F 31G X 5/16" 1 ML Misc Generic drug: Insulin Syringe-Needle U-100 USE 4 TIMES A DAY AS DIRECTED   Calcium Carb-Cholecalciferol 600-400 MG-UNIT  Tabs Take by mouth.   Cholecalciferol 25 MCG (1000 UT) tablet Take 3 tablets by mouth daily.   clindamycin 1 % lotion Commonly known as: CLEOCIN T Apply 1 Application topically every morning.   cycloSPORINE modified 25 MG capsule Commonly known as: NEORAL Take 2 capsules by mouth 2 (two) times daily.   dextrose 40 % Gel Commonly known as: GLUTOSE Take by mouth. TAKE 1 TUBE (15 GRAMS OF GLUCOSE) BY MOUTH EVERY 15 MINUTES AS NEEDED FOR LOW BLOOD SUGAR   fluorouracil 5 % cream Commonly known as: EFUDEX Apply topically.   insulin detemir 100 UNIT/ML injection Commonly known as: LEVEMIR Inject 18 Units into the skin at bedtime.   insulin glargine-yfgn 100 UNIT/ML injection Commonly known as: SEMGLEE Inject into the skin. INJECT 25 UNITS UNDER SKIN AT BEDTIME FOR DIABETES DO NOT MIX WITH OTHER INSULINS-DISCARD BOTTLE 28 DAYS AFTER OPENING. **REPLACES LEVEMIR** FOR DIABETES   ketoconazole 2 % cream Commonly known as: NIZORAL Apply 1 Application topically 2 (two) times daily.   levofloxacin 500 MG tablet Commonly known as: LEVAQUIN Take 1 tablet (500 mg total) by mouth daily.   magnesium oxide 400 (240 Mg) MG tablet Commonly known as: MAG-OX Take 2 tablets by mouth.   metoprolol tartrate 25 MG tablet Commonly known as: LOPRESSOR Take 1 tablet (25 mg total) by  mouth 2 (two) times daily.   Multivitamin Adult Chew Chew 1 tablet by mouth daily.   mycophenolate 250 MG capsule Commonly known as: CELLCEPT Take 250 mg by mouth 2 (two) times daily.   omeprazole 20 MG capsule Commonly known as: PRILOSEC Take 20 mg by mouth daily.   pravastatin 40 MG tablet Commonly known as: PRAVACHOL Take by mouth. TAKE ONE-HALF TABLET BY MOUTH AT BEDTIME FOR CHOLESTEROL   prazosin 1 MG capsule Commonly known as: MINIPRESS Take 1 mg by mouth at bedtime.   predniSONE 5 MG tablet Commonly known as: DELTASONE Take 5 mg by mouth daily with breakfast.   Synthroid 50 MCG tablet Generic  drug: levothyroxine Take 50 mcg by mouth daily before breakfast.   Uribel 118 MG Caps Take 1 capsule (118 mg total) by mouth 4 (four) times daily as needed. Started by: Carman Ching   venlafaxine 37.5 MG tablet Commonly known as: EFFEXOR Take 75 mg by mouth daily.   VITAMIN B-12 PO Take 1 tablet by mouth daily.        Allergies:  Allergies  Allergen Reactions   Levaquin [Levofloxacin] Other (See Comments)    Muscle and joint pain    Ibuprofen Other (See Comments)    Liver transplant    Family History: Family History  Problem Relation Age of Onset   Alcohol abuse Mother    Emphysema Father     Social History:   reports that he has never smoked. He has never been exposed to tobacco smoke. He has never used smokeless tobacco. He reports that he does not drink alcohol and does not use drugs.  Physical Exam: BP 98/62   Ht 5\' 7"  (1.702 m)   Wt 200 lb (90.7 kg)   BMI 31.32 kg/m   Constitutional:  Alert and oriented, no acute distress, nontoxic appearing HEENT: [place], AT Cardiovascular: No clubbing, cyanosis, or edema Respiratory: Normal respiratory effort, no increased work of breathing Skin: No rashes, bruises or suspicious lesions Neurologic: Grossly intact, no focal deficits, moving all 4 extremities Psychiatric: Normal mood and affect  Laboratory Data: Results for orders placed or performed in visit on 07/30/23  Microscopic Examination   Collection Time: 07/30/23  2:59 PM   Urine  Result Value Ref Range   WBC, UA 0-5 0 - 5 /hpf   RBC, Urine 3-10 (A) 0 - 2 /hpf   Epithelial Cells (non renal) 0-10 0 - 10 /hpf   Casts Present (A) None seen /lpf   Cast Type Hyaline casts N/A   Crystals Present (A) N/A   Crystal Type Uric Acid N/A   Mucus, UA Present (A) Not Estab.   Bacteria, UA Moderate (A) None seen/Few  Urinalysis, Complete   Collection Time: 07/30/23  2:59 PM  Result Value Ref Range   Specific Gravity, UA CANCELED    pH, UA CANCELED    Color,  UA Orange Yellow   Appearance Ur Hazy (A) Clear   Protein,UA CANCELED    Glucose, UA CANCELED    Ketones, UA CANCELED    Microscopic Examination See below:   Bladder Scan (Post Void Residual) in office   Collection Time: 07/30/23  3:03 PM  Result Value Ref Range   PVR 16.0 WU   Assessment & Plan:   1. Dysuria (Primary) UA today notable for microscopic hematuria and bacteriuria, however low suspicion for UTI in the absence of pyuria.  He is emptying appropriately.  Will send for standard and atypical cultures and contact him with  results.  In the meantime, I offered him Uribel.  He is already scheduled for cystoscopy with Dr. Richardo Hanks later this month and I agree with keeping this appointment. - Urinalysis, Complete - Bladder Scan (Post Void Residual) in office - CULTURE, URINE COMPREHENSIVE - Mycoplasma / ureaplasma culture - Meth-Hyo-M Bl-Na Phos-Ph Sal (URIBEL) 118 MG CAPS; Take 1 capsule (118 mg total) by mouth 4 (four) times daily as needed.  Dispense: 30 capsule; Refill: 3  Return for Will call with results, Keep follow-up as scheduled.  Carman Ching, PA-C  [facility] [place] [address], [address] [place], [place] [address] [phone]

## 2023-07-30 NOTE — Telephone Encounter (Signed)
Spoke to patient, he declines sending Uribel to an alternate pharmacy, will stick with Azo.

## 2023-08-03 LAB — CULTURE, URINE COMPREHENSIVE

## 2023-08-07 LAB — MYCOPLASMA / UREAPLASMA CULTURE
Mycoplasma hominis Culture: NEGATIVE
Ureaplasma urealyticum: NEGATIVE

## 2023-08-11 ENCOUNTER — Ambulatory Visit (INDEPENDENT_AMBULATORY_CARE_PROVIDER_SITE_OTHER): Payer: Medicare Other | Admitting: Urology

## 2023-08-11 VITALS — BP 125/75 | HR 76

## 2023-08-11 DIAGNOSIS — R300 Dysuria: Secondary | ICD-10-CM

## 2023-08-11 MED ORDER — CEPHALEXIN 500 MG PO CAPS: 500.0000 mg | ORAL_CAPSULE | Freq: Two times a day (BID) | ORAL | 0 refills | Status: AC

## 2023-08-11 NOTE — Progress Notes (Signed)
 Cystoscopy Procedure Note:  Indication: Dysuria  After informed consent and discussion of the procedure and its risks, [name] was positioned and prepped in the standard fashion. Cystoscopy was performed with a flexible cystoscope. The urethra, bladder neck and entire bladder was visualized in a standard fashion. The prostate was moderate in size. The ureteral orifices were visualized in their normal location and orientation.  Bladder mucosa grossly normal throughout, no abnormalities on retroflexion  Findings: Normal cystoscopy  ----------------------------------------------------------------------------------  Assessment and Plan: 83 year old male with long history of intermittent dysuria over the last 6 months.  He has had the most improvement after a 2-week course of Levaquin, however he discontinued that medication secondary to joint pain, and his symptoms returned within a few weeks.  High suspicion for prostatitis as etiology of his symptoms, has been very challenging to manage as he has had side effects from multiple antibiotics.  Cystoscopy normal today, recent CT normal at [facility].  Trial of Keflex 500 mg twice daily x 4 weeks for suspected prostatitis  Legrand Rams, MD 08/11/2023

## 2023-09-17 NOTE — Progress Notes (Signed)
 09/20/2023 7:06 PM   [name] 18-Jul-1940 [id number]  Referring provider: Dorothey Baseman, MD [phone] [name],  [place] [address]  Urological history: 1. BPH with LU TS -cysto (07/2023) - moderate BPH   2. Dysuria -cysto (07/2023) - NED - mycoplasma/ureaplasma (07/2023) - negative - urine culture (07/2023) - negative  - urine culture (06/2023) - negative  - urine culture (02/2023) - negative  - urine culture (01/2023) - negative  3. High risk hematuria  -non-smoker -CT w/wo (2022) - right nephrolithiasis -cysto (07/2023) - negative  Chief Complaint  Patient presents with   Dysuria   HPI: [name] is a 83 y.o. male who presents today for follow up with his daughter, [name].    Previous records reviewed.   He has been experiencing intermittent dysuria for the last 6 months.  He has been on Bactrim, Omnicef, doxycycline, Levaquin and more recently Keflex.  He states symptoms have abated since completing the Keflex.  He has had some itching on the tip of his penis and just inside the meatus on and off for the last 4 days.  He denied any rash or discharge.  Patient denies any modifying or aggravating factors.  Patient denies any recent UTI's, gross hematuria, dysuria or suprapubic/flank pain.  Patient denies any fevers, chills, nausea or vomiting.    UA yellow clear, specific gravity 1.025, pH 5.5, 0-5 squames, 0-5 WBCs, 0-5 RBCs and few bacteria  PMH: Past Medical History:  Diagnosis Date   Atrial fibrillation (HCC)    Cirrhosis of liver (HCC)    Diabetes mellitus without complication (HCC)    Dysrhythmia    Liver disease     Surgical History: Past Surgical History:  Procedure Laterality Date   CARDIAC ELECTROPHYSIOLOGY STUDY AND ABLATION  08/25/2017   EYE SURGERY     HERNIA REPAIR     Liver Transplant Surgery      Home Medications:  Allergies as of 09/20/2023       Reactions   Levaquin [levofloxacin] Other (See Comments)   Muscle and  joint pain    Ibuprofen Other (See Comments)   Liver transplant        Medication List        Accurate as of September 20, 2023  7:06 PM. If you have any questions, ask your nurse or doctor.          STOP taking these medications    levofloxacin 500 MG tablet Commonly known as: LEVAQUIN Stopped by: Deanna Wiater       TAKE these medications    allopurinol 100 MG tablet Commonly known as: ZYLOPRIM Take by mouth.   amLODipine 2.5 MG tablet Commonly known as: NORVASC Take 2.5 mg by mouth daily.   apixaban 5 MG Tabs tablet Commonly known as: ELIQUIS Take 5 mg by mouth 2 (two) times daily.   BD Insulin Syringe U/F 31G X 5/16" 1 ML Misc Generic drug: Insulin Syringe-Needle U-100 USE 4 TIMES A DAY AS DIRECTED   Calcium Carb-Cholecalciferol 600-400 MG-UNIT Tabs Take by mouth.   Cholecalciferol 25 MCG (1000 UT) tablet Take 3 tablets by mouth daily.   clindamycin 1 % lotion Commonly known as: CLEOCIN T Apply 1 Application topically every morning.   cycloSPORINE modified 25 MG capsule Commonly known as: NEORAL Take 2 capsules by mouth 2 (two) times daily.   dextrose 40 % Gel Commonly known as: GLUTOSE Take by mouth. TAKE 1 TUBE (15 GRAMS OF GLUCOSE) BY MOUTH EVERY 15 MINUTES  AS NEEDED FOR Lee BLOOD SUGAR   fluorouracil 5 % cream Commonly known as: EFUDEX Apply topically.   insulin detemir 100 UNIT/ML injection Commonly known as: LEVEMIR Inject 18 Units into the skin at bedtime.   insulin glargine-yfgn 100 UNIT/ML injection Commonly known as: SEMGLEE Inject into the skin. INJECT 25 UNITS UNDER SKIN AT BEDTIME FOR DIABETES DO NOT MIX WITH OTHER INSULINS-DISCARD BOTTLE 28 DAYS AFTER OPENING. **REPLACES LEVEMIR** FOR DIABETES   ketoconazole 2 % cream Commonly known as: NIZORAL Apply 1 Application topically 2 (two) times daily.   magnesium oxide 400 (240 Mg) MG tablet Commonly known as: MAG-OX Take 2 tablets by mouth.   metoprolol tartrate 25 MG  tablet Commonly known as: LOPRESSOR Take 1 tablet (25 mg total) by mouth 2 (two) times daily.   Multivitamin Adult Chew Chew 1 tablet by mouth daily.   mycophenolate 250 MG capsule Commonly known as: CELLCEPT Take 250 mg by mouth 2 (two) times daily.   omeprazole 20 MG capsule Commonly known as: PRILOSEC Take 20 mg by mouth daily.   phenazopyridine 100 MG tablet Commonly known as: PYRIDIUM Please specify directions, refills and quantity   pravastatin 40 MG tablet Commonly known as: PRAVACHOL Take by mouth. TAKE ONE-HALF TABLET BY MOUTH AT BEDTIME FOR CHOLESTEROL   prazosin 1 MG capsule Commonly known as: MINIPRESS Take 1 mg by mouth at bedtime.   predniSONE 5 MG tablet Commonly known as: DELTASONE Take 5 mg by mouth daily with breakfast.   Synthroid 50 MCG tablet Generic drug: levothyroxine Take 50 mcg by mouth daily before breakfast.   venlafaxine 37.5 MG tablet Commonly known as: EFFEXOR Take 75 mg by mouth daily.   VITAMIN B-12 PO Take 1 tablet by mouth daily.        Allergies:  Allergies  Allergen Reactions   Levaquin [Levofloxacin] Other (See Comments)    Muscle and joint pain    Ibuprofen Other (See Comments)    Liver transplant    Family History: Family History  Problem Relation Age of Onset   Alcohol abuse Mother    Emphysema Father     Social History:  reports that he has never smoked. He has never been exposed to tobacco smoke. He has never used smokeless tobacco. He reports that he does not drink alcohol and does not use drugs.  ROS: Pertinent ROS in HPI  Physical Exam: BP 114/73 (BP Location: Left Arm, Patient Position: Sitting, Cuff Size: Normal)   Pulse 84   Ht 5\' 7"  (1.702 m)   Wt 200 lb (90.7 kg)   SpO2 99%   BMI 31.32 kg/m   Constitutional:  Well nourished. Alert and oriented, No acute distress. HEENT: [place] AT, moist mucus membranes.  Trachea midline Cardiovascular: No clubbing, cyanosis, or edema. Respiratory: Normal  respiratory effort, no increased work of breathing. Neurologic: Grossly intact, no focal deficits, moving all 4 extremities. Psychiatric: Normal mood and affect.  Laboratory Data: Comprehensive Metabolic Panel (CMP) Order: [id number] Component Ref Range & Units 3 wk ago  Glucose 70 - 110 mg/dL 67 Lee   Sodium 784 - 696 mmol/L 143  Potassium 3.6 - 5.1 mmol/L 4.1  Chloride 97 - 109 mmol/L 106  Carbon Dioxide (CO2) 22.0 - 32.0 mmol/L 31.3  Urea Nitrogen (BUN) 7 - 25 mg/dL 34 High   Creatinine 0.7 - 1.3 mg/dL 1.4 High   Glomerular Filtration Rate (eGFR) >60 mL/min/1.73sq m 50 Lee   Comment: CKD-EPI (2021) does not include patient's race in the calculation  of eGFR.  Monitoring changes of plasma creatinine and eGFR over time is useful for monitoring kidney function.  Interpretive Ranges for eGFR (CKD-EPI 2021):  eGFR:       >60 mL/min/1.73 sq. m - Normal eGFR:       30-59 mL/min/1.73 sq. m - Moderately Decreased eGFR:       15-29 mL/min/1.73 sq. m  - Severely Decreased eGFR:       < 15 mL/min/1.73 sq. m  - Kidney Failure   Note: These eGFR calculations do not apply in acute situations when eGFR is changing rapidly or patients on dialysis.  Calcium 8.7 - 10.3 mg/dL 8.9  AST 8 - 39 U/L 21  ALT 6 - 57 U/L 33  Alk Phos (alkaline Phosphatase) 34 - 104 U/L 65  Albumin 3.5 - 4.8 g/dL 4  Bilirubin, Total 0.3 - 1.2 mg/dL 2.8 High   Protein, Total 6.1 - 7.9 g/dL 6.2  A/G Ratio 1.0 - 5.0 gm/dL 1.8  Resulting Agency [facility]   Specimen Collected: 08/23/23 08:56   Performed by: [facility] Last Resulted: 08/23/23 16:33  Received From: [facility]  Result Received: 09/17/23 11:17    CBC w/auto Differential (5 Part) Order: [id number] Component Ref Range & Units 3 wk ago  WBC (White Blood Cell Count) 4.1 - 10.2 10^3/uL 7.4  RBC (Red Blood Cell Count) 4.69 - 6.13 10^6/uL 4.53 Lee   Hemoglobin 14.1 - 18.1 gm/dL 40.9   Hematocrit 81.1 - 52.0 % 44.5  MCV (Mean Corpuscular Volume) 80.0 - 100.0 fl 98.2  MCH (Mean Corpuscular Hemoglobin) 27.0 - 31.2 pg 32.2 High   MCHC (Mean Corpuscular Hemoglobin Concentration) 32.0 - 36.0 gm/dL 91.4  Platelet Count 782 - 450 10^3/uL 167  RDW-CV (Red Cell Distribution Width) 11.6 - 14.8 % 13.2  MPV (Mean Platelet Volume) 9.4 - 12.4 fl 9 Lee   Neutrophils 1.50 - 7.80 10^3/uL 3.45  Lymphocytes 1.00 - 3.60 10^3/uL 3.04  Monocytes 0.00 - 1.50 10^3/uL 0.68  Eosinophils 0.00 - 0.55 10^3/uL 0.09  Basophils 0.00 - 0.09 10^3/uL 0.05  Neutrophil % 32.0 - 70.0 % 46.9  Lymphocyte % 10.0 - 50.0 % 41.3  Monocyte % 4.0 - 13.0 % 9.2  Eosinophil % 1.0 - 5.0 % 1.2  Basophil% 0.0 - 2.0 % 0.7  Immature Granulocyte % <=0.7 % 0.7  Immature Granulocyte Count <=0.06 10^3/L 0.05  Resulting Agency [facility]   Specimen Collected: 08/23/23 08:56   Performed by: [facility] Last Resulted: 08/23/23 12:27  Received From: [facility]  Result Received: 09/17/23 11:17   Urinalysis See EPIC and HPI  I have reviewed the labs.   Pertinent Imaging: N/A  Assessment & Plan:    1. Dysuria -abated   2. BPH with LU TS -Continue to manage conservatively  3. Penile itching -UA benign - I sent the urine for culture to see if any yeast may grow out as his symptoms may be the result of yeast as he has been on several antibiotics recently   Return in about 1 year (around 09/19/2024) for pending urine culture results .  These notes generated with voice recognition software. I apologize for typographical errors.  Cloretta Ned  [facility] [address]  [address] [place], [place] [address] [phone]

## 2023-09-20 ENCOUNTER — Encounter: Payer: Self-pay | Admitting: Urology

## 2023-09-20 ENCOUNTER — Ambulatory Visit (INDEPENDENT_AMBULATORY_CARE_PROVIDER_SITE_OTHER): Payer: Medicare Other | Admitting: Urology

## 2023-09-20 ENCOUNTER — Other Ambulatory Visit
Admission: RE | Admit: 2023-09-20 | Discharge: 2023-09-20 | Disposition: A | Discharge status: Home / Self Care | Attending: Urology | Admitting: Urology

## 2023-09-20 VITALS — BP 114/73 | HR 84 | Ht 67.0 in | Wt 200.0 lb

## 2023-09-20 DIAGNOSIS — N4889 Other specified disorders of penis: Secondary | ICD-10-CM

## 2023-09-20 DIAGNOSIS — N138 Other obstructive and reflux uropathy: Secondary | ICD-10-CM

## 2023-09-20 DIAGNOSIS — N401 Enlarged prostate with lower urinary tract symptoms: Secondary | ICD-10-CM

## 2023-09-20 DIAGNOSIS — R300 Dysuria: Secondary | ICD-10-CM

## 2023-09-20 LAB — URINALYSIS, COMPLETE (UACMP) WITH MICROSCOPIC
Bilirubin Urine: NEGATIVE
Glucose, UA: NEGATIVE mg/dL
Hgb urine dipstick: NEGATIVE
Ketones, ur: NEGATIVE mg/dL
Leukocytes,Ua: NEGATIVE
Nitrite: NEGATIVE
Protein, ur: NEGATIVE mg/dL
Specific Gravity, Urine: 1.025 (ref 1.005–1.030)
pH: 5.5 (ref 5.0–8.0)

## 2023-09-21 LAB — URINE CULTURE: Culture: NO GROWTH

## 2023-09-24 ENCOUNTER — Other Ambulatory Visit: Payer: Self-pay

## 2023-09-24 MED ORDER — NYSTATIN-TRIAMCINOLONE 100000-0.1 UNIT/GM-% EX CREA: 1.0000 | TOPICAL_CREAM | Freq: Two times a day (BID) | CUTANEOUS | 0 refills | Status: AC

## 2023-10-06 ENCOUNTER — Ambulatory Visit (INDEPENDENT_AMBULATORY_CARE_PROVIDER_SITE_OTHER)

## 2023-10-06 ENCOUNTER — Ambulatory Visit
Admission: RE | Admit: 2023-10-06 | Discharge: 2023-10-06 | Disposition: A | Discharge status: Home / Self Care | Source: Ambulatory Visit | Attending: Emergency Medicine | Admitting: Emergency Medicine

## 2023-10-06 VITALS — BP 131/87 | HR 78 | Temp 98.3°F | Resp 16

## 2023-10-06 DIAGNOSIS — R0602 Shortness of breath: Secondary | ICD-10-CM

## 2023-10-06 NOTE — ED Triage Notes (Signed)
 Patient presents to UC with daughter for rib pain x 1.5 weeks ago. Seen at [facility] ED. No fractures noted on x-rays. SOB and congestion since last night. Treating pain with tylenol .

## 2023-10-06 NOTE — ED Provider Notes (Signed)
 [facility]    CSN: [id number] Arrival date & time: 10/06/23  1149      History   Chief Complaint Chief Complaint  Patient presents with   Chest Injury    Entered by patient    HPI [name] is a 83 y.o. male.   HPI  83 year old male with past medical history significant for liver disease, dysrhythmia, diabetes, liver cirrhosis, atrial fibrillation presents for evaluation of chest congestion and shortness of breath that began last night.  He suffered a ground-level fall 1.5 weeks ago and was evaluated in the [facility] emergency department.  At that time he had a negative CT scan and rib series.  He reports that he is continue to have pain on the right side of his ribs but he denies any bruising or swelling.  He states that he has had an infrequent cough and he has been doing more sneezing, which does increase pain.  He denies any wheezing or fever.  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   Anxiety 07/17/2020   Depression 05/24/2020   Gastroesophageal reflux disease 05/24/2020   Hypomagnesemia 11/28/2019   Hyperkalemia 05/16/2016   Abnormal EKG 05/16/2016   PAF (paroxysmal atrial fibrillation) (HCC) 05/16/2016   Chest pain 05/16/2016   Benign essential HTN 05/14/2016   Mixed hyperlipidemia 04/09/2015   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   DDD (degenerative disc disease), 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 Authorizing Provider  allopurinol (ZYLOPRIM) 100 MG tablet Take by mouth. 01/30/21   [provider]  amLODipine  (NORVASC ) 2.5 MG tablet Take 2.5 mg by mouth daily. 05/03/23 05/02/24  [provider]  apixaban (ELIQUIS) 5 MG TABS tablet Take 5 mg by mouth 2 (two) times daily.    [provider]  Calcium  Carb-Cholecalciferol 600-400 MG-UNIT TABS Take by mouth. 08/02/13   [provider]  Cholecalciferol 25 MCG (1000 UT) tablet Take 3 tablets by mouth daily. 04/26/20   [provider]  clindamycin (CLEOCIN T) 1 % lotion Apply 1 Application topically every morning. 04/06/23   [provider]  Cyanocobalamin (VITAMIN B-12 PO) Take 1 tablet by mouth daily.    [provider]  cycloSPORINE  modified (NEORAL ) 25 MG capsule Take 2 capsules by mouth 2 (two) times daily. 08/15/20   [provider]  dextrose  (GLUTOSE) 40 % GEL Take by mouth. TAKE 1 TUBE (15 GRAMS OF GLUCOSE) BY MOUTH EVERY 15 MINUTES AS NEEDED FOR LOW BLOOD SUGAR 05/10/23   [provider]  fluorouracil (EFUDEX) 5 % cream Apply topically. 05/16/21   [provider]  insulin  detemir (LEVEMIR ) 100 UNIT/ML  injection Inject 18 Units into the skin at bedtime. 09/09/17   [provider]  insulin  glargine-yfgn (SEMGLEE) 100 UNIT/ML injection Inject into the skin. INJECT 25 UNITS UNDER SKIN AT BEDTIME FOR DIABETES DO NOT MIX WITH OTHER INSULINS-DISCARD BOTTLE 28 DAYS AFTER OPENING. **REPLACES LEVEMIR ** FOR DIABETES 11/11/22   [provider]  Insulin  Syringe-Needle U-100 (BD INSULIN  SYRINGE U/F) 31G X 5/16" 1 ML MISC USE 4 TIMES A DAY AS DIRECTED 06/06/15   [provider]  ketoconazole  (NIZORAL) 2 % cream Apply 1 Application topically 2 (two) times daily.    [provider]  levothyroxine (SYNTHROID) 50 MCG tablet Take 50 mcg by mouth daily before breakfast. 10/28/21   [provider]  magnesium  oxide (MAG-OX) 400 (240 Mg) MG tablet Take 2 tablets by mouth. 07/02/16   [provider]  metoprolol  tartrate (LOPRESSOR ) 25 MG tablet Take 1 tablet (25 mg total) by mouth 2 (two) times daily. 06/06/20   Cook, Jayce G, DO  Multiple Vitamins-Minerals (MULTIVITAMIN ADULT) CHEW Chew 1 tablet by mouth daily.    [provider]  mycophenolate  (CELLCEPT ) 250 MG capsule Take 250 mg by mouth 2 (two) times daily.    [provider]  nystatin -triamcinolone  (MYCOLOG II) cream Apply 1 Application topically 2 (two) times daily. 09/24/23   Matilde Son A, PA-C  omeprazole (PRILOSEC) 20 MG capsule Take 20 mg by mouth daily.    [provider]  phenazopyridine  (PYRIDIUM ) 100 MG tablet Please specify directions, refills and quantity 07/30/23   Vaillancourt, Samantha, PA-C  pravastatin (PRAVACHOL) 40 MG tablet Take by mouth. TAKE ONE-HALF TABLET BY MOUTH AT BEDTIME FOR CHOLESTEROL 11/11/22   [provider]  prazosin  (MINIPRESS ) 1 MG capsule Take 1 mg by mouth at bedtime. 11/11/22   [provider]  predniSONE  (DELTASONE ) 5 MG tablet Take 5 mg by mouth daily with breakfast.    [provider]  venlafaxine  (EFFEXOR ) 37.5 MG tablet Take 75 mg by mouth daily.     [provider]    Family History Family History  Problem Relation Age of Onset   Alcohol abuse Mother    Emphysema Father     Social History Social History   Tobacco Use   Smoking status: Never    Passive exposure: Never   Smokeless tobacco: Never  Vaping Use   Vaping status: Never Used  Substance Use Topics   Alcohol use: No   Drug use: No     Allergies   Levaquin  [levofloxacin ] and Ibuprofen   Review of Systems Review of Systems   Constitutional:  Negative for fever.  HENT:  Positive for sneezing.   Respiratory:  Positive for cough and shortness of breath. Negative for wheezing.   Cardiovascular:  Positive for chest pain.       Right rib pain     Physical Exam Triage Vital Signs ED Triage Vitals  Encounter Vitals Group     BP      Systolic BP Percentile      Diastolic BP Percentile      Pulse      Resp      Temp      Temp src      SpO2      Weight      Height      Head Circumference      Peak Flow      Pain Score      Pain Loc      Pain Education  Exclude from Growth Chart    No data found.  Updated Vital Signs BP 131/87 (BP Location: Left Arm)   Pulse 78   Temp 98.3 F (36.8 C) (Oral)   Resp 16   SpO2 97%   Visual Acuity Right Eye Distance:   Left Eye Distance:   Bilateral Distance:    Right Eye Near:   Left Eye Near:    Bilateral Near:     Physical Exam Vitals and nursing note reviewed.  Constitutional:      Appearance: Normal appearance. He is not ill-appearing.  HENT:     Head: Normocephalic and atraumatic.  Cardiovascular:     Rate and Rhythm: Normal rate and regular rhythm.     Pulses: Normal pulses.     Heart sounds: Normal heart sounds. No murmur heard.    No friction rub. No gallop.  Pulmonary:     Effort: Pulmonary effort is normal.     Breath sounds: Normal breath sounds. No wheezing, rhonchi or rales.  Skin:    General: Skin is warm and dry.  Neurological:     Mental Status: He is alert.      UC Treatments / Results  Labs (all labs ordered are listed, but only abnormal results are displayed) Labs Reviewed - No data to display  EKG   Radiology DG Chest 2 View Result Date: 10/06/2023 CLINICAL DATA:  Acute shortness of breath. EXAM: CHEST - 2 VIEW COMPARISON:  May 16, 2016. FINDINGS: The heart size and mediastinal contours are within normal limits. Both lungs are clear. The visualized skeletal structures are unremarkable. IMPRESSION: No active  cardiopulmonary disease. Electronically Signed   By: Rosalene Colon M.D.   On: 10/06/2023 13:17    Procedures Procedures (including critical care time)  Medications Ordered in UC Medications - No data to display  Initial Impression / Assessment and Plan / UC Course  I have reviewed the triage vital signs and the nursing notes.  Pertinent labs & imaging results that were available during my care of the patient were reviewed by me and considered in my medical decision making (see chart for details).   Patient is a very pleasant, nontoxic-appearing 83 year old male presenting for evaluation of shortness of breath and chest congestion that started last night.  He did suffer a ground-level fall 12 days ago and was diagnosed with a right rib contusion at [facility] ER.  He is on blood thinners and he has significant bruising to his left lower extremity from the fall but he denies any bruising to his chest wall.  He is able to speak in full sentence without dyspnea or tachypnea.  Respiratory rate at triage was 16 with a 97% room oxygen saturation.  His lungs are clear to auscultation in all fields.  Heart sounds are S1-S2 with a regular rate and rhythm.  Given that he did suffer a ground-level fall and has been experiencing rib pain I am concerned that he may be developing pneumonia as result of breathing shallow.  I will obtain a chest x-ray to evaluate for any acute cardiopulmonary pathology.  Chest x-ray independently reviewed and evaluated by me.  Impression: No evidence of infiltrate or effusion.  Cardiomediastinal silhouette appears normal.  Radiology overread is pending. Radiology impression states no active cardiopulmonary disease.  Etiology of the patient's shortness breath and chest congestion is unclear.  He is able to speak in full sentence without dyspnea or tachypnea, he is moving good air when auscultating his chest, his respiratory  rate was 16 with a 97% room oxygen saturation.  It may be that  the patient is breathing shallow because of his rib pain I have advised him to try lidocaine patches to see if this improves the rib pain and thereby improves his feeling of shortness of breath.  Return precautions reviewed.   Final Clinical Impressions(s) / UC Diagnoses   Final diagnoses:  Shortness of breath     Discharge Instructions      As we discussed, your chest x-ray did not show any evidence of pneumonia or rib fracture.  The cause of your shortness of breath is unclear.  You may be coming down with a viral illness or it may be because you are breathing shallow due to your rib pain.  Try topical lidocaine patches applied directly to your site of pain to see if improves your pain and thereby improves your breathing.  If you develop any fever, worsening shortness of breath, wheezing, or cough please return for reevaluation.     ED Prescriptions   None    PDMP not reviewed this encounter.   Kent Pear, NP 10/06/23 1325

## 2023-10-06 NOTE — Discharge Instructions (Addendum)
 As we discussed, your chest x-ray did not show any evidence of pneumonia or rib fracture.  The cause of your shortness of breath is unclear.  You may be coming down with a viral illness or it may be because you are breathing shallow due to your rib pain.  Try topical lidocaine patches applied directly to your site of pain to see if improves your pain and thereby improves your breathing.  If you develop any fever, worsening shortness of breath, wheezing, or cough please return for reevaluation.

## 2023-11-28 ENCOUNTER — Ambulatory Visit
Admission: EM | Admit: 2023-11-28 | Discharge: 2023-11-28 | Disposition: A | Discharge status: Home / Self Care | Attending: Family Medicine | Admitting: Family Medicine

## 2023-11-28 DIAGNOSIS — S30821A Blister (nonthermal) of abdominal wall, initial encounter: Secondary | ICD-10-CM

## 2023-11-28 MED ORDER — MUPIROCIN 2 % EX OINT: 1.0000 | TOPICAL_OINTMENT | Freq: Three times a day (TID) | CUTANEOUS | 0 refills | Status: AC

## 2023-11-28 NOTE — Discharge Instructions (Addendum)
 Keep area clean and dry.  Start mupirocin topical antibiotic ointment 3 times a day for a week to the area to help prevent infection.  Please monitor area closely and if you develop any redness, fevers, chills, warmth please seek reevaluation if these occur.  Follow-up with your PCP in 2 to 3 days for recheck.  Hope you feel better soon!

## 2023-11-28 NOTE — ED Triage Notes (Signed)
 Pt c/o blister on lower abdomen x1 day. States area filled with pus & redness. Denies any drainage.

## 2023-11-28 NOTE — ED Provider Notes (Signed)
 [facility]    CSN: [id number] Arrival date & time: 11/28/23  0816      History   Chief Complaint Chief Complaint  Patient presents with   Blister    HPI [name] is a 83 y.o. male presents for blister.  Patient has a complex medical history including liver transplant on immunosuppressive medications, diabetes, A-fib who presents for blister on abdominal wall.  States he noticed it yesterday but does not recall any type of injury or burn.  Denies any friction injury.  No pain to the area.  No fevers or chills.  No history of MRSA.  No other concerns at this time.  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   Anxiety 07/17/2020   Depression 05/24/2020   Gastroesophageal reflux disease 05/24/2020   Hypomagnesemia 11/28/2019   Hyperkalemia 05/16/2016   Abnormal EKG 05/16/2016   PAF (paroxysmal atrial fibrillation) (HCC) 05/16/2016   Chest pain 05/16/2016   Benign essential HTN 05/14/2016   Mixed hyperlipidemia 04/09/2015   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   DDD (degenerative disc disease), 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 Authorizing  Provider  allopurinol (ZYLOPRIM) 100 MG tablet Take by mouth. 01/30/21  Yes [provider]  amLODipine  (NORVASC ) 2.5 MG tablet Take 2.5 mg by mouth daily. 05/03/23 05/02/24 Yes [provider]  apixaban (ELIQUIS) 5 MG TABS tablet Take 5 mg by mouth 2 (two) times daily.   Yes [provider]  Calcium  Carb-Cholecalciferol 600-400 MG-UNIT TABS Take by mouth. 08/02/13  Yes [provider]  Cholecalciferol 25 MCG (1000 UT) tablet Take 3 tablets by mouth daily. 04/26/20  Yes [provider]  clindamycin (CLEOCIN T) 1 % lotion Apply 1 Application topically every morning. 04/06/23  Yes [provider]  Cyanocobalamin (VITAMIN B-12 PO) Take 1 tablet by mouth daily.   Yes [provider]  cycloSPORINE  modified (NEORAL ) 25 MG capsule Take 2 capsules by mouth 2 (two) times daily. 08/15/20  Yes [provider]  dextrose  (GLUTOSE) 40 % GEL Take by mouth. TAKE 1 TUBE (15 GRAMS OF GLUCOSE) BY MOUTH EVERY 15 MINUTES AS NEEDED FOR LOW BLOOD SUGAR 05/10/23  Yes [provider]  fluorouracil (EFUDEX) 5 % cream Apply topically. 05/16/21  Yes [provider]  insulin  detemir (LEVEMIR ) 100 UNIT/ML injection Inject 18 Units into the skin at bedtime. 09/09/17  Yes [provider]  insulin  glargine-yfgn (SEMGLEE) 100 UNIT/ML injection Inject into the skin. INJECT 25 UNITS UNDER SKIN AT BEDTIME FOR DIABETES DO NOT MIX WITH OTHER INSULINS-DISCARD BOTTLE 28 DAYS AFTER OPENING. **REPLACES LEVEMIR ** FOR DIABETES 11/11/22  Yes [provider]  Insulin  Syringe-Needle U-100 (BD INSULIN  SYRINGE U/F) 31G X 5/16 1 ML MISC USE 4 TIMES A DAY AS DIRECTED 06/06/15  Yes [provider]  ketoconazole (NIZORAL) 2 % cream Apply 1 Application topically 2 (two) times daily.   Yes [provider]  levothyroxine (SYNTHROID) 50 MCG tablet Take 50 mcg by mouth daily before breakfast. 10/28/21  Yes [provider]  magnesium   oxide (MAG-OX) 400 (240 Mg) MG tablet Take 2 tablets by mouth. 07/02/16  Yes [provider]  Multiple Vitamins-Minerals (MULTIVITAMIN ADULT) CHEW Chew 1 tablet by mouth daily.   Yes [provider]  mupirocin ointment (BACTROBAN) 2 % Apply 1 Application topically 3 (three) times daily for 7 days. 11/28/23 12/05/23 Yes Laron Boorman, Jodi R, NP  mycophenolate  (CELLCEPT ) 250 MG capsule Take 250 mg by mouth 2 (two) times daily.   Yes [provider]  nystatin -triamcinolone  (MYCOLOG II) cream Apply 1 Application topically 2 (two) times daily. 09/24/23  Yes McGowan, Cathleen Coach A, PA-C  omeprazole (PRILOSEC) 20 MG capsule Take 20 mg by mouth daily.   Yes [provider]  phenazopyridine  (PYRIDIUM ) 100 MG tablet Please specify directions, refills and quantity 07/30/23  Yes Vaillancourt, Samantha, PA-C  pravastatin (PRAVACHOL) 40 MG tablet Take by mouth. TAKE ONE-HALF TABLET BY MOUTH AT BEDTIME FOR CHOLESTEROL 11/11/22  Yes [provider]  prazosin  (MINIPRESS ) 1 MG capsule Take 1 mg by mouth at bedtime. 11/11/22  Yes [provider]  predniSONE  (DELTASONE ) 5 MG tablet Take 5 mg by mouth daily with breakfast.   Yes [provider]  venlafaxine  (EFFEXOR ) 37.5 MG tablet Take 75 mg by mouth daily.    Yes [provider]  metoprolol  tartrate (LOPRESSOR ) 25 MG tablet Take 1 tablet (25 mg total) by mouth 2 (two) times daily. 06/06/20   Cook, Jayce G, DO    Family History Family History  Problem Relation Age of Onset   Alcohol abuse Mother    Emphysema Father     Social History Social History   Tobacco Use   Smoking status: Never    Passive exposure: Never   Smokeless tobacco: Never  Vaping Use   Vaping status: Never Used  Substance Use Topics   Alcohol use: No   Drug use: No     Allergies   Levaquin  [levofloxacin ] and Ibuprofen   Review of Systems Review of Systems  Skin:        Blister to abdominal wall     Physical Exam Triage  Vital Signs ED Triage Vitals  Encounter Vitals Group     BP 11/28/23 0828 115/79     Girls Systolic BP Percentile --      Girls Diastolic BP Percentile --      Boys Systolic BP Percentile --      Boys Diastolic BP Percentile --      Pulse Rate 11/28/23 0828 75     Resp 11/28/23 0828 16     Temp 11/28/23 0828 98.8 F (37.1 C)     Temp Source 11/28/23 0828 Oral     SpO2 11/28/23 0828 98 %     Weight 11/28/23 0827 195 lb (88.5 kg)     Height 11/28/23 0827 5' 7 (1.702 m)     Head Circumference --      Peak Flow --      Pain Score 11/28/23 0833 0     Pain Loc --      Pain Education --  Exclude from Growth Chart --    No data found.  Updated Vital Signs BP 115/79 (BP Location: Left Arm)   Pulse 75   Temp 98.8 F (37.1 C) (Oral)   Resp 16   Ht 5' 7 (1.702 m)   Wt 195 lb (88.5 kg)   SpO2 98%   BMI 30.54 kg/m   Visual Acuity Right Eye Distance:   Left Eye Distance:   Bilateral Distance:    Right Eye Near:   Left Eye Near:    Bilateral Near:     Physical Exam Vitals and nursing note reviewed.  Constitutional:      Appearance: Normal appearance.  HENT:     Head: Normocephalic and atraumatic.   Eyes:     Pupils: Pupils are equal, round, and reactive to light.    Cardiovascular:     Rate and Rhythm: Normal rate.  Pulmonary:     Effort: Pulmonary effort is normal.  Abdominal:     Comments: There is a 1.5 x 1 cm blister to the right abdominal wall.  There is no surrounding erythema, warmth.  No active drainage.   Skin:    General: Skin is warm and dry.   Neurological:     General: No focal deficit present.     Mental Status: He is alert and oriented to person, place, and time.   Psychiatric:        Mood and Affect: Mood normal.        Behavior: Behavior normal.      UC Treatments / Results  Labs (all labs ordered are listed, but only abnormal results are displayed) Labs Reviewed - No data to display  EKG   Radiology No results  found.  Procedures Procedures (including critical care time)  Medications Ordered in UC Medications - No data to display  Initial Impression / Assessment and Plan / UC Course  I have reviewed the triage vital signs and the nursing notes.  Pertinent labs & imaging results that were available during my care of the patient were reviewed by me and considered in my medical decision making (see chart for details).     Reviewed exam and symptoms with patient.  Unclear cause of blister but given he is on immunosuppressives will do topical mupirocin to prevent infection.  Advised to monitor area and seek reevaluation if symptoms worsen, red flags reviewed and he verbalized understanding.  PCP follow-up 2 to 3 days for recheck.  ER precautions reviewed. Final Clinical Impressions(s) / UC Diagnoses   Final diagnoses:  Blister of abdominal wall without infection, initial encounter     Discharge Instructions      Keep area clean and dry.  Start mupirocin topical antibiotic ointment 3 times a day for a week to the area to help prevent infection.  Please monitor area closely and if you develop any redness, fevers, chills, warmth please seek reevaluation if these occur.  Follow-up with your PCP in 2 to 3 days for recheck.  Hope you feel better soon!    ED Prescriptions     Medication Sig Dispense Auth. Provider   mupirocin ointment (BACTROBAN) 2 % Apply 1 Application topically 3 (three) times daily for 7 days. 22 g November Sypher, Jodi R, NP      PDMP not reviewed this encounter.   Alleen Arbour, NP 11/28/23 [phone]

## 2023-12-01 ENCOUNTER — Ambulatory Visit: Payer: Self-pay

## 2023-12-09 ENCOUNTER — Ambulatory Visit: Payer: Self-pay

## 2023-12-12 ENCOUNTER — Ambulatory Visit: Payer: Self-pay

## 2023-12-17 ENCOUNTER — Ambulatory Visit: Payer: Self-pay

## 2024-05-03 ENCOUNTER — Other Ambulatory Visit: Payer: Self-pay | Admitting: Internal Medicine

## 2024-05-03 DIAGNOSIS — R17 Unspecified jaundice: Secondary | ICD-10-CM

## 2024-05-04 ENCOUNTER — Ambulatory Visit
Admission: RE | Admit: 2024-05-04 | Discharge: 2024-05-04 | Disposition: A | Discharge status: Home / Self Care | Source: Ambulatory Visit | Attending: Internal Medicine | Admitting: Internal Medicine

## 2024-05-04 DIAGNOSIS — R17 Unspecified jaundice: Secondary | ICD-10-CM | POA: Diagnosis present

## 2024-05-16 ENCOUNTER — Ambulatory Visit: Payer: Self-pay

## 2024-05-30 ENCOUNTER — Telehealth: Payer: Self-pay

## 2024-05-30 NOTE — Telephone Encounter (Signed)
 Pt stats he want to be seen . He wants to know if you will see him without a referral. He has never been a pt of [facility]  or of yours at [facility]. 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

## 2024-06-22 ENCOUNTER — Ambulatory Visit: Payer: Self-pay

## 2024-07-06 ENCOUNTER — Ambulatory Visit: Admitting: Gastroenterology

## 2024-07-06 ENCOUNTER — Ambulatory Visit
Admission: RE | Admit: 2024-07-06 | Discharge: 2024-07-06 | Disposition: A | Discharge status: Home / Self Care | Source: Ambulatory Visit | Attending: Gastroenterology | Admitting: Gastroenterology

## 2024-07-06 ENCOUNTER — Ambulatory Visit
Admission: RE | Admit: 2024-07-06 | Discharge: 2024-07-06 | Disposition: A | Discharge status: Home / Self Care | Attending: Gastroenterology | Admitting: Gastroenterology

## 2024-07-06 VITALS — BP 121/71 | HR 92 | Temp 97.6°F | Ht 67.0 in | Wt 186.5 lb

## 2024-07-06 DIAGNOSIS — K5900 Constipation, unspecified: Secondary | ICD-10-CM | POA: Diagnosis not present

## 2024-07-06 DIAGNOSIS — R194 Change in bowel habit: Secondary | ICD-10-CM

## 2024-07-06 DIAGNOSIS — R634 Abnormal weight loss: Secondary | ICD-10-CM

## 2024-07-06 MED ORDER — PEG 3350-KCL-NABCB-NACL-NASULF 236 G PO SOLR: 4000.0000 mL | Freq: Once | ORAL | 0 refills | Status: AC

## 2024-07-06 NOTE — Progress Notes (Signed)
 "   Gastroenterology Consultation  Referring Provider:     Glover Lenis, MD Primary Care Physician:  Glover Lenis, MD Primary Gastroenterologist:  Dr. Melany     Reason for Consultation:     Constipation.         HPI:   [name] is a 84 y.o. y/o male referred for consultation & management of constipation by Dr. Glover Lenis, MD.    Mr. Shiffler was diagnosed with alcoholic cirrhosis in 2010 and underwent liver transplantation in 2015 at [facility].  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  He currently takes prednisone , mycophenolate  and cyclosporine  as his immunosuppression regimen.  Last year he developed a UTI refractory to conventional treatment, as well as an infected tooth.  He was on antibiotics for the better part of a year.  In the wake of that, he developed constipation.  He is used to having a bowel movement once a day, and if he does not, feels that he needs to, and takes MiraLAX.  However, recently he has developed diarrhea within a few hours of the MiraLAX dose.  Per his daughter Connell, present for the visit today, he also sometimes has diarrhea.  Stools are always loose.  There has been no blood in his stool.  He has lost 7 pounds because he does not like to eat, feeling bloated after eating.  Last colonoscopy was in 2014.  He takes Eliquis for atrial fibrillation.  He has hypothyroidism and recently underwent testing at the [facility].  Results are not available to me today.    Past Medical History:  Diagnosis Date   Atrial fibrillation (HCC)    Cirrhosis of liver (HCC)    Diabetes mellitus without complication (HCC)    Dysrhythmia    Liver disease     Past Surgical History:  Procedure Laterality Date   CARDIAC ELECTROPHYSIOLOGY STUDY AND ABLATION  08/25/2017   EYE SURGERY     HERNIA REPAIR     Liver Transplant Surgery      Prior to Admission medications  Medication Sig Start Date End Date Taking? Authorizing Provider  allopurinol  (ZYLOPRIM) 100 MG tablet Take by mouth. 01/30/21  Yes [provider]  apixaban (ELIQUIS) 5 MG TABS tablet Take 5 mg by mouth 2 (two) times daily.   Yes [provider]  Calcium  Carb-Cholecalciferol 600-400 MG-UNIT TABS Take by mouth. 08/02/13  Yes [provider]  Cholecalciferol 25 MCG (1000 UT) tablet Take 3 tablets by mouth daily. 04/26/20  Yes [provider]  clindamycin (CLEOCIN T) 1 % lotion Apply 1 Application topically every morning. 04/06/23  Yes [provider]  Cyanocobalamin (VITAMIN B-12 PO) Take 1 tablet by mouth daily.   Yes [provider]  cycloSPORINE  modified (NEORAL ) 25 MG capsule Take 2 capsules by mouth 2 (two) times daily. 08/15/20  Yes [provider]  dextrose  (GLUTOSE) 40 % GEL Take by mouth. TAKE 1 TUBE (15 GRAMS OF GLUCOSE) BY MOUTH EVERY 15 MINUTES AS NEEDED FOR LOW BLOOD SUGAR 05/10/23  Yes [provider]  fluorouracil (EFUDEX) 5 % cream Apply topically. 05/16/21  Yes [provider]  insulin  detemir (LEVEMIR ) 100 UNIT/ML injection Inject 18 Units into the skin at bedtime. 09/09/17  Yes [provider]  insulin  glargine-yfgn (SEMGLEE) 100 UNIT/ML injection Inject into the skin. INJECT 25 UNITS UNDER SKIN AT BEDTIME FOR DIABETES DO NOT MIX WITH OTHER INSULINS-DISCARD BOTTLE 28 DAYS AFTER OPENING. **REPLACES LEVEMIR ** FOR DIABETES 11/11/22  Yes [provider]  Insulin  Syringe-Needle U-100 (BD INSULIN  SYRINGE U/F) 31G X 5/16 1 ML MISC USE 4 TIMES A DAY AS DIRECTED 06/06/15  Yes [provider]  ketoconazole (NIZORAL) 2 % cream Apply 1 Application topically 2 (two) times daily.   Yes [provider]  levothyroxine (SYNTHROID) 50 MCG tablet Take 50 mcg by mouth daily before breakfast. 10/28/21  Yes [provider]  magnesium  oxide (MAG-OX) 400 (240 Mg) MG tablet Take 2 tablets by mouth. 07/02/16  Yes [provider]  metoprolol  tartrate (LOPRESSOR )  25 MG tablet Take 1 tablet (25 mg total) by mouth 2 (two) times daily. 06/06/20  Yes Cook, Jayce G, DO  Multiple Vitamins-Minerals (MULTIVITAMIN ADULT) CHEW Chew 1 tablet by mouth daily.   Yes [provider]  mycophenolate  (CELLCEPT ) 250 MG capsule Take 250 mg by mouth 2 (two) times daily.   Yes [provider]  nystatin -triamcinolone  (MYCOLOG II) cream Apply 1 Application topically 2 (two) times daily. 09/24/23  Yes McGowan, Clotilda A, PA-C  omeprazole (PRILOSEC) 20 MG capsule Take 20 mg by mouth daily.   Yes [provider]  phenazopyridine  (PYRIDIUM ) 100 MG tablet Please specify directions, refills and quantity 07/30/23  Yes Vaillancourt, Samantha, PA-C  pravastatin (PRAVACHOL) 40 MG tablet Take by mouth. TAKE ONE-HALF TABLET BY MOUTH AT BEDTIME FOR CHOLESTEROL 11/11/22  Yes [provider]  prazosin  (MINIPRESS ) 1 MG capsule Take 1 mg by mouth at bedtime. 11/11/22  Yes [provider]  predniSONE  (DELTASONE ) 5 MG tablet Take 5 mg by mouth daily with breakfast.   Yes [provider]  venlafaxine  (EFFEXOR ) 37.5 MG tablet Take 75 mg by mouth daily.    Yes [provider]    Family History  Problem Relation Age of Onset   Alcohol abuse Mother    Emphysema Father      Social History[1]  Allergies as of 07/06/2024 - Review Complete 07/06/2024  Allergen Reaction Noted   Levaquin  [levofloxacin ] Other (See Comments) 07/05/2023   Ibuprofen Other (See Comments) 10/09/2014    Review of Systems:    All systems reviewed and negative except where noted in HPI.   Physical Exam:  There were no vitals taken for this visit. No LMP for male patient. General:   Alert,  Well-developed, well-nourished, pleasant and cooperative in NAD Head:  Normocephalic and atraumatic. Eyes:  Sclera clear, no icterus.   Conjunctiva pink. Ears:  Normal auditory acuity. Neck:  Supple; no masses or thyromegaly. Lungs:  Respirations even and unlabored.  Clear  throughout to auscultation.   No wheezes, crackles, or rhonchi. No acute distress. Heart:  Regular rate and rhythm; no murmurs, clicks, rubs, or gallops. Abdomen:  Normal bowel sounds.  No bruits.  Soft, non-tender and non-distended without masses, hepatosplenomegaly or hernias noted.  No guarding or rebound tenderness.  Negative Carnett sign.   Rectal:  Deferred.  Pulses:  Normal pulses noted. Extremities:  No clubbing or edema.  No cyanosis. Neurologic:  Alert and oriented x3;  grossly normal neurologically. Skin:  Intact without significant lesions or rashes.  No jaundice. Lymph Nodes:  No significant cervical adenopathy. Psych:  Alert and cooperative. Normal mood and affect.  Imaging Studies: No results found.  Assessment and Plan:   Abran Gavigan is a 84 y.o. y/o male with new onset constipation after a year-long antibiotic requirement.  It is not clear to me if Mr. Crum has constipation with overflow diarrhea (as would be suggested by his postprandial bloating and rapid development of  diarrhea after 1 MiraLAX dose), or if he has primary diarrhea and is tied to the idea that he needs to have a bowel movement once a day, thus is overmedicating with MiraLAX.  Plan get KUB to evaluate for fecal loading. He also needs updated colonoscopy.  He has CKD so will use GoLytely as his prep.  He will likely need to be done at the hospital.  He will need cardiac clearance for Eliquis cessation for atrial fibrillation.  After his colonoscopy I will recommend that he treat with a fiber supplement rather than MiraLAX.    Clotilda Schaffer, MD   Note: This dictation was prepared with Dragon dictation along with smaller phrase technology. Any transcriptional errors that result from this process are unintentional.       [1]  Social History Tobacco Use   Smoking status: Never    Passive exposure: Never   Smokeless tobacco: Never  Vaping Use   Vaping status: Never Used  Substance Use Topics    Alcohol use: No   Drug use: No   "

## 2024-07-06 NOTE — Addendum Note (Signed)
 Addended by: Elmor Kost, MELANI Y on: 07/06/2024 01:19 PM   Modules accepted: Orders

## 2024-09-26 ENCOUNTER — Ambulatory Visit: Admit: 2024-09-26 | Admitting: Gastroenterology

## 2024-09-26 SURGERY — COLONOSCOPY
Anesthesia: General
